# Patient Record
Sex: Male | Born: 1935 | Race: White | Hispanic: No | Marital: Married | Smoking: Former smoker
Health system: Southern US, Community
[De-identification: ages and names within clinical notes are randomized; demographics above are authoritative.]

## PROBLEM LIST (undated history)

## (undated) DIAGNOSIS — K439 Ventral hernia without obstruction or gangrene: Secondary | ICD-10-CM

## (undated) DIAGNOSIS — C61 Malignant neoplasm of prostate: Secondary | ICD-10-CM

## (undated) DIAGNOSIS — S82899A Other fracture of unspecified lower leg, initial encounter for closed fracture: Secondary | ICD-10-CM

## (undated) DIAGNOSIS — C801 Malignant (primary) neoplasm, unspecified: Secondary | ICD-10-CM

## (undated) DIAGNOSIS — S8290XA Unspecified fracture of unspecified lower leg, initial encounter for closed fracture: Secondary | ICD-10-CM

## (undated) HISTORY — DX: Other fracture of unspecified lower leg, initial encounter for closed fracture: S82.899A

## (undated) HISTORY — DX: Unspecified fracture of unspecified lower leg, initial encounter for closed fracture: S82.90XA

## (undated) HISTORY — DX: Ventral hernia without obstruction or gangrene: K43.9

## (undated) HISTORY — DX: Malignant (primary) neoplasm, unspecified: C80.1

---
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
## 1997-06-22 DIAGNOSIS — S8290XA Unspecified fracture of unspecified lower leg, initial encounter for closed fracture: Secondary | ICD-10-CM

## 1997-06-22 HISTORY — DX: Unspecified fracture of unspecified lower leg, initial encounter for closed fracture: S82.90XA

## 1999-06-23 DIAGNOSIS — S82899A Other fracture of unspecified lower leg, initial encounter for closed fracture: Secondary | ICD-10-CM

## 1999-06-23 HISTORY — DX: Other fracture of unspecified lower leg, initial encounter for closed fracture: S82.899A

## 2000-06-29 ENCOUNTER — Encounter: Payer: Self-pay | Admitting: Specialist

## 2000-06-30 ENCOUNTER — Inpatient Hospital Stay (HOSPITAL_COMMUNITY): Admission: RE | Admit: 2000-06-30 | Discharge: 2000-07-01 | Payer: Self-pay | Admitting: Specialist

## 2000-06-30 ENCOUNTER — Encounter: Payer: Self-pay | Admitting: Specialist

## 2009-10-15 ENCOUNTER — Ambulatory Visit (HOSPITAL_COMMUNITY): Admission: RE | Admit: 2009-10-15 | Discharge: 2009-10-15 | Payer: Self-pay | Admitting: Urology

## 2009-10-22 ENCOUNTER — Ambulatory Visit (HOSPITAL_COMMUNITY): Admission: RE | Admit: 2009-10-22 | Discharge: 2009-10-22 | Payer: Self-pay | Admitting: Urology

## 2009-12-20 ENCOUNTER — Observation Stay (HOSPITAL_COMMUNITY): Admission: RE | Admit: 2009-12-20 | Discharge: 2009-12-21 | Payer: Self-pay | Admitting: General Surgery

## 2009-12-20 ENCOUNTER — Encounter (INDEPENDENT_AMBULATORY_CARE_PROVIDER_SITE_OTHER): Payer: Self-pay | Admitting: Urology

## 2009-12-20 HISTORY — PX: PROSTATE SURGERY: SHX751

## 2010-01-10 ENCOUNTER — Ambulatory Visit (HOSPITAL_COMMUNITY): Admission: RE | Admit: 2010-01-10 | Discharge: 2010-01-10 | Payer: Self-pay | Admitting: General Surgery

## 2010-03-14 ENCOUNTER — Encounter (INDEPENDENT_AMBULATORY_CARE_PROVIDER_SITE_OTHER): Payer: Self-pay | Admitting: General Surgery

## 2010-03-14 HISTORY — PX: HERNIA REPAIR: SHX51

## 2010-03-15 ENCOUNTER — Inpatient Hospital Stay (HOSPITAL_COMMUNITY): Admission: RE | Admit: 2010-03-15 | Discharge: 2010-03-16 | Payer: Self-pay | Admitting: General Surgery

## 2010-05-28 ENCOUNTER — Encounter
Admission: RE | Admit: 2010-05-28 | Discharge: 2010-05-28 | Payer: Self-pay | Source: Home / Self Care | Admitting: General Surgery

## 2010-09-04 LAB — BASIC METABOLIC PANEL
BUN: 12 mg/dL (ref 6–23)
CO2: 31 mEq/L (ref 19–32)
Calcium: 8.6 mg/dL (ref 8.4–10.5)
Chloride: 102 mEq/L (ref 96–112)
Chloride: 104 mEq/L (ref 96–112)
Creatinine, Ser: 0.95 mg/dL (ref 0.4–1.5)
GFR calc non Af Amer: >60 mL/min (ref >60)
GFR calc non Af Amer: >60 mL/min (ref >60)
Potassium: 4.1 mEq/L (ref 3.5–5.1)
Potassium: 4.7 mEq/L (ref 3.5–5.1)
Sodium: 139 mEq/L (ref 135–145)

## 2010-09-04 LAB — CBC
HCT: 33.9 % — ABNORMAL LOW (ref 39.0–52.0)
HCT: 39.5 % (ref 39.0–52.0)
MCH: 30.9 pg (ref 26.0–34.0)
MCH: 30.9 pg (ref 26.0–34.0)
MCHC: 34.5 g/dL (ref 30.0–36.0)
MCHC: 34.6 g/dL (ref 30.0–36.0)
Platelets: 212 10*3/uL (ref 150–400)
RBC: 4.42 MIL/uL (ref 4.22–5.81)
RDW: 16.3 % — ABNORMAL HIGH (ref 11.5–15.5)
WBC: 8.8 10*3/uL (ref 4.0–10.5)

## 2010-09-04 LAB — DIFFERENTIAL
Basophils Absolute: 0.1 10*3/uL (ref 0.0–0.1)
Basophils Relative: 1 % (ref 0–1)
Eosinophils Absolute: 0.3 10*3/uL (ref 0.0–0.7)
Neutro Abs: 4.1 10*3/uL (ref 1.7–7.7)

## 2010-09-04 LAB — SURGICAL PCR SCREEN: MRSA, PCR: NEGATIVE

## 2010-09-06 LAB — BASIC METABOLIC PANEL
BUN: 11 mg/dL (ref 6–23)
CO2: 31 mEq/L (ref 19–32)
Creatinine, Ser: 0.92 mg/dL (ref 0.4–1.5)
Glucose, Bld: 116 mg/dL — ABNORMAL HIGH (ref 70–99)
Potassium: 4.6 mEq/L (ref 3.5–5.1)

## 2010-09-06 LAB — DIFFERENTIAL
Basophils Relative: 1 % (ref 0–1)
Eosinophils Relative: 2 % (ref 0–5)
Monocytes Absolute: 0.4 10*3/uL (ref 0.1–1.0)
Neutrophils Relative %: 66 % (ref 43–77)

## 2010-09-06 LAB — CBC
MCV: 88.7 fL (ref 78.0–100.0)
RDW: 15.2 % (ref 11.5–15.5)
WBC: 7.9 10*3/uL (ref 4.0–10.5)

## 2010-09-06 LAB — SURGICAL PCR SCREEN: Staphylococcus aureus: NEGATIVE

## 2010-09-07 LAB — COMPREHENSIVE METABOLIC PANEL
AST: 17 U/L (ref 0–37)
CO2: 29 mEq/L (ref 19–32)
Chloride: 104 mEq/L (ref 96–112)
Creatinine, Ser: 0.95 mg/dL (ref 0.4–1.5)
Glucose, Bld: 135 mg/dL — ABNORMAL HIGH (ref 70–99)
Potassium: 4 mEq/L (ref 3.5–5.1)
Sodium: 142 mEq/L (ref 135–145)

## 2010-09-07 LAB — URINALYSIS, ROUTINE W REFLEX MICROSCOPIC
Ketones, ur: NEGATIVE mg/dL
Protein, ur: 30 mg/dL — AB
Specific Gravity, Urine: 1.019 (ref 1.005–1.030)
Urobilinogen, UA: 0.2 mg/dL (ref 0.0–1.0)

## 2010-09-07 LAB — DIFFERENTIAL
Basophils Absolute: 0 10*3/uL (ref 0.0–0.1)
Eosinophils Absolute: 0.3 10*3/uL (ref 0.0–0.7)
Lymphocytes Relative: 29 % (ref 12–46)
Lymphs Abs: 2.2 10*3/uL (ref 0.7–4.0)

## 2010-09-07 LAB — CBC
HCT: 40.4 % (ref 39.0–52.0)
MCH: 30.3 pg (ref 26.0–34.0)
MCH: 30.7 pg (ref 26.0–34.0)
MCHC: 33.5 g/dL (ref 30.0–36.0)
MCHC: 34 g/dL (ref 30.0–36.0)
MCV: 90.4 fL (ref 78.0–100.0)
RBC: 3.94 MIL/uL — ABNORMAL LOW (ref 4.22–5.81)
RDW: 15.4 % (ref 11.5–15.5)
WBC: 9 10*3/uL (ref 4.0–10.5)

## 2010-09-07 LAB — URINE CULTURE
Colony Count: >=100000
Special Requests: NEGATIVE

## 2010-09-07 LAB — BASIC METABOLIC PANEL
BUN: 10 mg/dL (ref 6–23)
CO2: 29 mEq/L (ref 19–32)
Calcium: 8 mg/dL — ABNORMAL LOW (ref 8.4–10.5)
GFR calc non Af Amer: >60 mL/min (ref >60)
Potassium: 3.6 mEq/L (ref 3.5–5.1)

## 2010-09-07 LAB — PROTIME-INR
INR: 1.12 (ref 0.00–1.49)
Prothrombin Time: 14.3 seconds (ref 11.6–15.2)

## 2010-09-07 LAB — URINE MICROSCOPIC-ADD ON

## 2010-09-07 LAB — APTT: aPTT: 34 seconds (ref 24–37)

## 2010-09-09 LAB — CREATININE, SERUM
GFR calc Af Amer: >60 mL/min (ref >60)
GFR calc non Af Amer: >60 mL/min (ref >60)

## 2010-11-07 NOTE — Op Note (Signed)
[facility]  Patient:    Stephen English, Stephen English                     MRN: [id number] Proc. Date: 06/30/00 Adm. Date:  [id number] Attending:  Lubertha South                           Operative Report  PREOPERATIVE DIAGNOSIS:  Left closed displaced bimalleolar ankle fracture.  POSTOPERATIVE DIAGNOSIS:  Left closed displaced bimalleolar ankle fracture.  PROCEDURE:  Open reduction internal fixation of the left bimalleolar ankle fracture using two 4.0 medial malleolar screws and a single lag screw with a six hole 1/3 semitubular plate to the lateral malleolus.  SURGEON:  Dr. Vira Browns.  ASSISTANT:  Cherly Beach, P.A.-C.  ANESTHESIA:  GOT, Dr. Shireen Quan.  ESTIMATED BLOOD LOSS:  30 cc.  TOTAL TOURNIQUET TIME:  280 mmHg for 67 minutes.  BRIEF CLINICAL NOTE:  The patient is a 77 year old male who fell at the door to a store while he was shopping on New Years Eve, June 21, 2000. He reportedly fell twisting his ankle. He had had a history of previous left IM nailing of a comminuted distal tibial fracture and that has since healed the fracture of the left malleolus; the left ankle was a bimalleolar ankle fracture mildly displaced. He was seen initially in the emergency room at [facility]. Evaluated, posterior splint applied, placed on crutches nonweightbearing. He returns for open reduction internal fixation of his left ankle fracture.  INTRAOPERATIVE FINDINGS:  The patient had a comminuted lateral malleolus fracture at the level of plafond requiring debridement of the fracture site and some old scar tissue associated with the old previous fibula fracture in the vicinity. The medial malleolus fracture was a transverse fracture at the level just below the plafond.  DESCRIPTION OF PROCEDURE:  After adequate general anesthesia, the left lower extremity was prepped with duraprep solution, a bump under the left buttock, tourniquet about the left upper  thigh, and draped in the usual manner. The left lower extremity elevated and exsanguinated with Esmarch bandage, tourniquet inflated to 280 mmHg.  The incision over the area of the left superficial distal fibula in line with its superficial portion extending over the tip of the fibula distally and proximally over an area of about 10 cm through the skin and subcutaneous layers using a 10 blade scalpel carried down to the periosteum. Subperiosteal dissection then carried both medial and lateral using a sharp wooden handle periosteal elevator. The fracture site identified, debrided of bony fragments which were interpose preventing reduction. The entire lateral surface including the anterior and posterior aspects of the fibula fracture site were examined and the fracture site again debrided using curettes appropriately. With this then the fracture was reduced using a towel clip reducing tenaculum. A lag screw was then placed from distal posterior across the fracture site obliquely fixing it to the anterior medial aspect of the proximal fracture fragment. Drill holes first placed using a 3.5 drill bit and then using the top half sleeve and drilling the deep cortex measuring the depth almost 50 mm. A 50 mm screw was placed fixing the fracture site lagging it nicely. A six hole 1/3 semitubular plate was then carefully contoured to the lateral aspect of the fibula. This was then placed against the lateral aspect of the fibula affixed to the proximal fracture fragments using three 3.5 cordless screws drilling each hole measuring for  depth and placing the appropriate self tapping screw. The distal screw holes were then each filled with fully threaded cancellous screws. First the most proximal of the two screws and then successively each of the distal. Care was taken to ensure that none of the screws crossed into the joint line. Next, the medial malleolar fracture site was exposed using a curvilinear  excision flat based posterior. The saphenous vein was ligated until allow for exposure of the fracture site. It was located more medial and posterior than usual. The fracture site was then opened and the joint inspected, irrigation performed of the ankle joint using copious amounts of irrigant solution. The fracture site itself was debrided at the interpose periosteum as well as curetted of old hematoma and organizing hematoma here. The joint itself was irrigated and bony debris and cartilagineus debris within the joint was carefully irrigated out and removed. A bare rongeur used to remove some synovial tissue that had become interposed as well. With this then, the fracture site was reapproximated and a single 2 mm K wire affixed to the pin at the fracture site anteriorly. C-arm fluoro was used to ascertain the correct position and alignment of the fractured fragment, reduction and anatomic position alignment. A 2.5 drill was then used to drill parallel to the first pin. Using the parallel guide first in a 2 mm pin and then over drilling the 2 mm hole with a 2.5 drill. This was measured for depth and a 45 mm screw placed posteriorly. The anterior pin was then removed and a 45 mm screw placed anteriorly after first over drilling this again with a 2.5 drill tapping placed in the appropriate 4.0 cancellous screw. This fixed the medial malleolar fracture site nicely. Excellent purchase was obtained on the bony fragments and excellent compression across the fracture site. C-arm fluoroscopy was then used to obtain permanent C-arm images in the AP and 15 degree oblique and lateral views. The medial malleolus screw posteriorly showed some minimal posterior extension beyond the tibial cortex; however, it was not great enough to represent a problem. It was left in place as it provided excellent fixation. The medial incision was then closed, interrupted 2-0 Vicryl sutures to the subcutaneous layers,  skin closed with stainless steel staples. The lateral incision was closed by approximating the deep subcu layers with interrupted #0 Vicryl sutures, the more superficial  layers with interrupted 2-0 Vicryl sutures and the skin with stainless steel staples. Adaptic 4 x 4s, ABD pads affixed to the skin with a sterile Webril and a well padded posterior short leg cas was applied with the ankle at 90 degrees. The tourniquet was released. Total tourniquet time of 280 mmHg, 67 minutes. The patient was then reactivated and returned to the recovery room in satisfactory condition. All instrument and sponge counts were correct. DD:  06/30/00 TD:  06/30/00 Job: [id number]

## 2010-11-24 ENCOUNTER — Encounter (INDEPENDENT_AMBULATORY_CARE_PROVIDER_SITE_OTHER): Payer: Self-pay | Admitting: General Surgery

## 2011-02-11 ENCOUNTER — Encounter (INDEPENDENT_AMBULATORY_CARE_PROVIDER_SITE_OTHER): Payer: Self-pay | Admitting: General Surgery

## 2011-03-10 ENCOUNTER — Other Ambulatory Visit (HOSPITAL_COMMUNITY): Payer: Self-pay | Admitting: Urology

## 2011-03-10 DIAGNOSIS — C61 Malignant neoplasm of prostate: Secondary | ICD-10-CM

## 2011-03-13 ENCOUNTER — Ambulatory Visit (INDEPENDENT_AMBULATORY_CARE_PROVIDER_SITE_OTHER): Payer: Medicare Other | Admitting: General Surgery

## 2011-03-13 ENCOUNTER — Encounter (INDEPENDENT_AMBULATORY_CARE_PROVIDER_SITE_OTHER): Payer: Self-pay | Admitting: General Surgery

## 2011-03-13 VITALS — BP 134/86 | HR 60 | Temp 96.9°F | Resp 16 | Ht 69.0 in | Wt 223.8 lb

## 2011-03-13 DIAGNOSIS — Z9889 Other specified postprocedural states: Secondary | ICD-10-CM

## 2011-03-13 DIAGNOSIS — Z8719 Personal history of other diseases of the digestive system: Secondary | ICD-10-CM

## 2011-03-13 NOTE — Progress Notes (Signed)
Chief Complaint  Patient presents with  . Follow-up    [facility] 03/14/10    HPI Stephen English is a 77 y.o. male.  HPI 23 year old Caucasian male comes in today for long-term followup. I last saw him on July 04, 2010. In September 2011 he underwent an open repair of a chronically incarcerated large left inguinal hernia with mesh. He developed a postoperative hematoma that was slow to resolve. He comes in today for followup. He denies any nausea or vomiting. He denies any abdominal pain. He denies any numbness or tingling in his scrotum. He denies any bulge in his groin. He does occasionally have some infrequent stools.  Otherwise he denies any new medical problems, surgeries, or trips to  the hospital or emergency room. He states that his PSA level has increased in that Dr. Brunilda Payor is working it up.   Past Medical History  Diagnosis Date  . Broken ankle 2001  . Broken leg 1999  . Ventral hernia   . Cancer     prostate    Past Surgical History  Procedure Date  . Appendectomy 1948  . Hernia repair 03/14/10    [facility]  . Prostate surgery 12/2009    Family History  Problem Relation Age of Onset  . Cancer Mother     oral    Social History History  Substance Use Topics  . Smoking status: Former Games developer  . Smokeless tobacco: Not on file   Comment: quit 1987  . Alcohol Use: No    No Known Allergies  Current Outpatient Prescriptions  Medication Sig Dispense Refill  . Calcium Carbonate-Vitamin D (CALCIUM + D PO) Take by mouth daily.        . Naproxen Sodium (ALEVE PO) Take by mouth as needed.          Review of Systems Review of Systems  Constitutional: Negative for activity change, appetite change and unexpected weight change.  Eyes: Negative for redness and visual disturbance.  Respiratory: Negative for chest tightness and shortness of breath.   Cardiovascular: Negative for chest pain.  Gastrointestinal: Negative for nausea, vomiting, abdominal pain, diarrhea and blood in  stool.  Neurological: Negative for dizziness and seizures.  Hematological: Negative.   Psychiatric/Behavioral: Negative.     Blood pressure 134/86, pulse 60, temperature 96.9 F (36.1 C), temperature source Temporal, resp. rate 16, height 5\' 9"  (1.753 m), weight 223 lb 12.8 oz (101.515 kg).  Physical Exam Physical Exam  Vitals reviewed. Constitutional: He is oriented to person, place, and time. He appears well-developed and well-nourished.       Elderly appearing; uses a walker  HENT:  Head: Normocephalic and atraumatic.  Eyes: Conjunctivae are normal. No scleral icterus.  Neck: Normal range of motion. Neck supple. No JVD present.  Cardiovascular: Normal rate, regular rhythm and normal heart sounds.   Pulmonary/Chest: Effort normal and breath sounds normal. No respiratory distress. He has no wheezes.  Abdominal: Soft. Bowel sounds are normal. There is no tenderness. There is no rebound and no guarding. Hernia confirmed negative in the right inguinal area and confirmed negative in the left inguinal area.    Genitourinary:     Musculoskeletal: He exhibits no edema.  Lymphadenopathy:    He has no cervical adenopathy.  Neurological: He is alert and oriented to person, place, and time.  Skin: Skin is warm and dry.  Psychiatric: He has a normal mood and affect. His behavior is normal. Judgment and thought content normal.    Data Reviewed  My office note from 07/04/10   Assessment    S/p open repair of chronically incarcerated left inguinal hernia with mesh- no evidence of recurrence   Ventral hernia  Plan    I think he is doing well with respect to his inguinal area.  We did discuss strategies to improve his bowel habits (water and fiber). He was given Agricultural engineer.  He has had the ventral hernia for years & it doesn't bother him.  I recommended observation. We discussed signs & symptoms of incarceration & strangulation.  I will see on a prn basis  Atilano Ina,  MD       Gaynelle Adu M 03/13/2011, 1:33 PM

## 2011-03-13 NOTE — Patient Instructions (Signed)
If the bulge in your abdomen becomes hard & very tender, go to the emergency room

## 2011-03-19 ENCOUNTER — Encounter (HOSPITAL_COMMUNITY)
Admission: RE | Admit: 2011-03-19 | Discharge: 2011-03-19 | Disposition: A | Discharge status: Home / Self Care | Payer: Medicare Other | Source: Ambulatory Visit | Attending: Urology | Admitting: Urology

## 2011-03-19 DIAGNOSIS — C61 Malignant neoplasm of prostate: Secondary | ICD-10-CM

## 2011-03-19 DIAGNOSIS — Z8546 Personal history of malignant neoplasm of prostate: Secondary | ICD-10-CM | POA: Insufficient documentation

## 2011-03-19 DIAGNOSIS — C7951 Secondary malignant neoplasm of bone: Secondary | ICD-10-CM | POA: Insufficient documentation

## 2011-03-19 DIAGNOSIS — R972 Elevated prostate specific antigen [PSA]: Secondary | ICD-10-CM | POA: Insufficient documentation

## 2011-03-19 MED ORDER — TECHNETIUM TC 99M MEDRONATE IV KIT
25.0000 | PACK | Freq: Once | INTRAVENOUS | Status: AC | PRN
  Administered 2011-03-19: 25 via INTRAVENOUS

## 2011-12-15 IMAGING — CR DG CHEST 2V
3 series · 3 of 3 positions shown · non-contrast
Comparison: None

CLINICAL DATA: Preoperative evaluation for left scrotal inguinal
hernia. Ex-smoker

CHEST - 2 VIEW

[w chest pa]
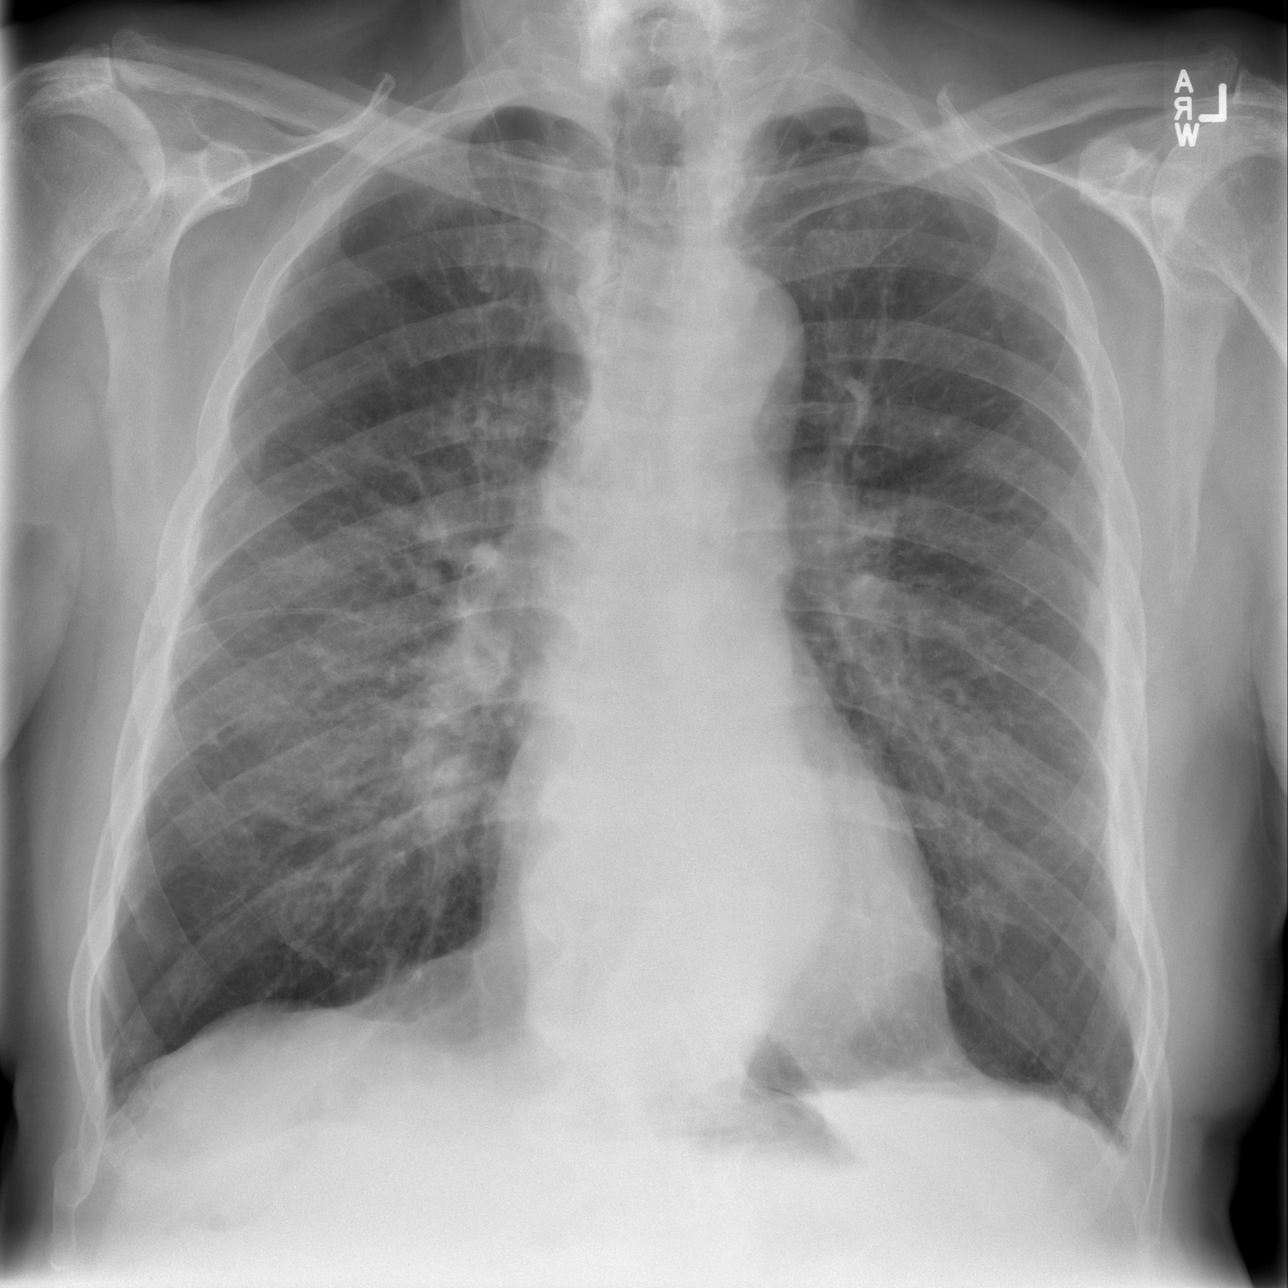

[w chest lat (1 of 2)]
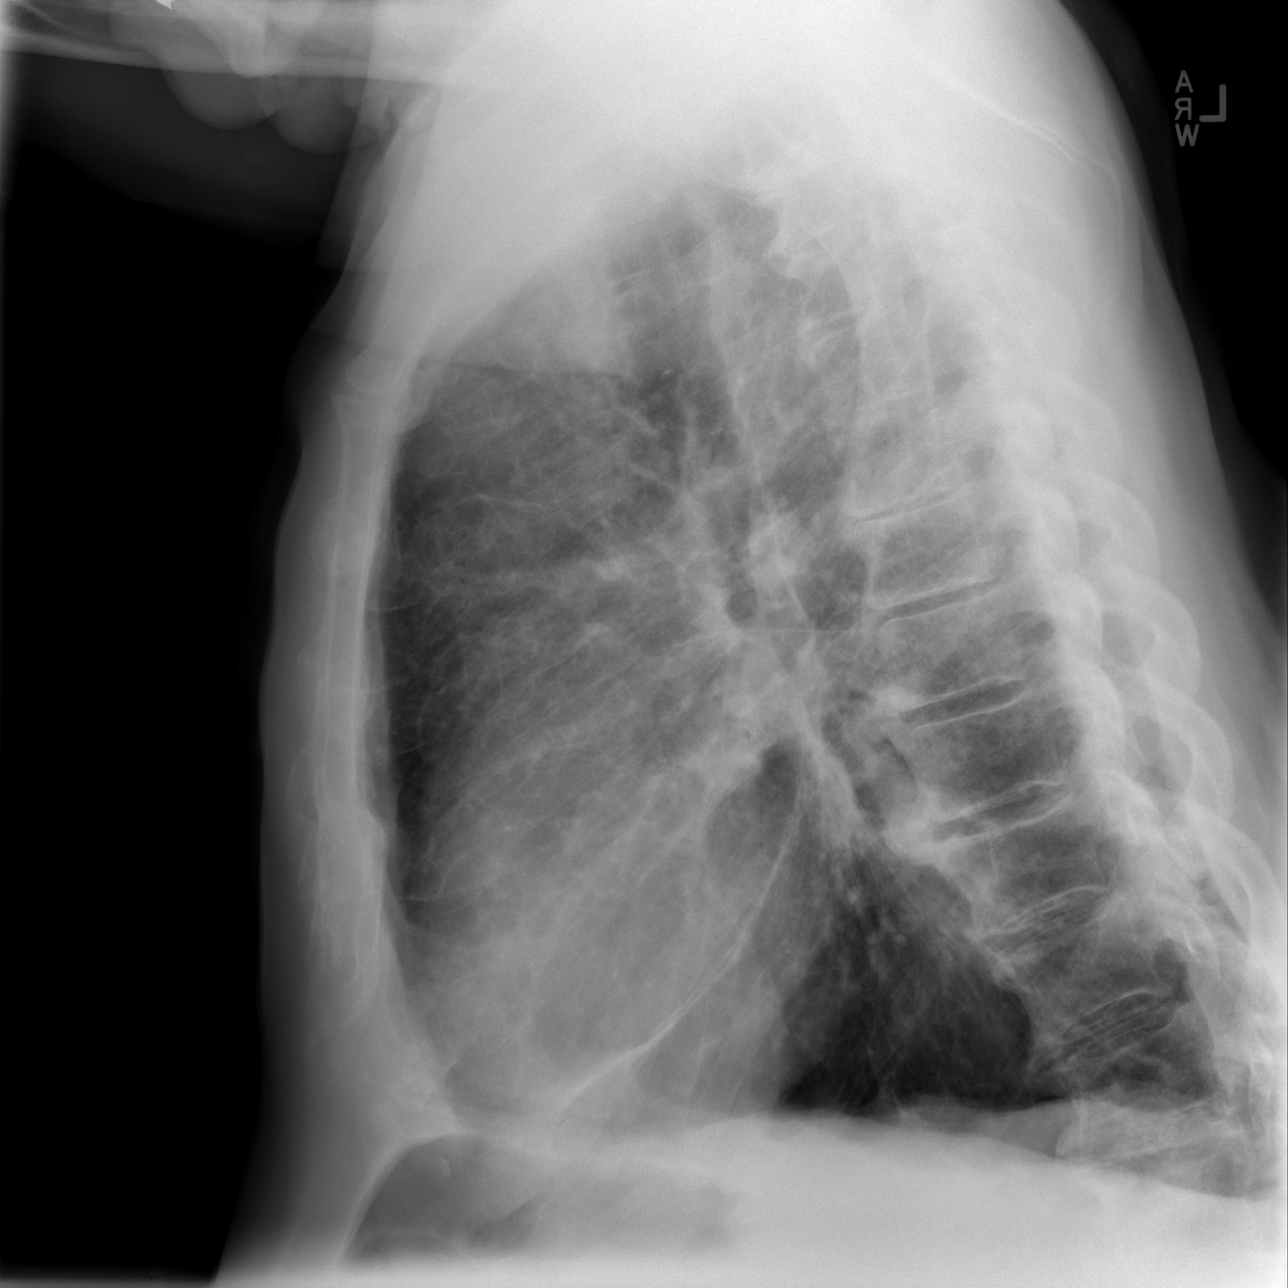

[w chest lat (2 of 2)]
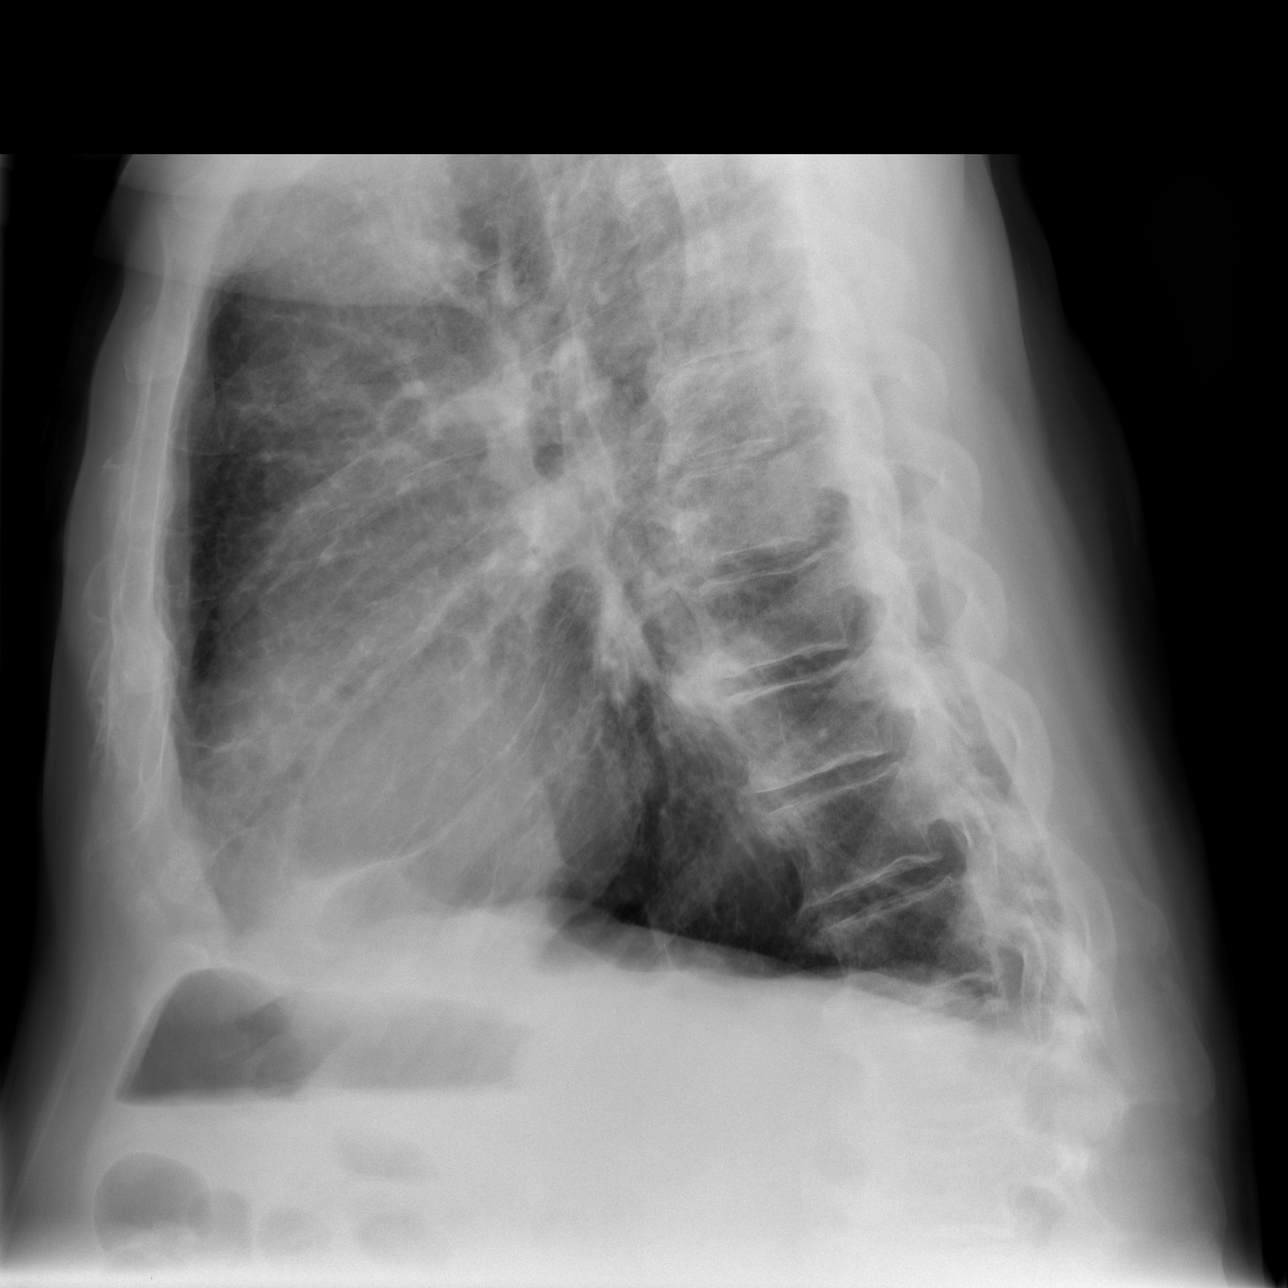

[3 of 3 positions shown; findings below may reference images not displayed]

FINDINGS: Changes of COPD are evident and taking this into
consideration heart size is upper limits of normal to mildly
enlarged.  Mild aortic ectasia is seen.  Mediastinal structures are
otherwise unremarkable.

Prominence of the interstitial markings with peribronchial cuffing
and fissural prominence is noted.  No pleural fluid or septal lines
are seen suggesting these changes are chronic in nature and not
indicative of early interstitial edema but likely related to the
patient's history of smoking.

No focal infiltrates are seen.  Mild bilateral apical pleural
thickening is noted.

Bony structures demonstrate degenerative osteophytosis of the mid
and lower thoracic spine.
IMPRESSION: No acute cardiopulmonary abnormality suggested.  Borderline cardiac
enlargement with chronic interstitial changes suspected.

## 2012-03-28 ENCOUNTER — Other Ambulatory Visit: Payer: Self-pay | Admitting: Specialist

## 2012-03-28 DIAGNOSIS — M25561 Pain in right knee: Secondary | ICD-10-CM

## 2012-04-01 ENCOUNTER — Ambulatory Visit
Admission: RE | Admit: 2012-04-01 | Discharge: 2012-04-01 | Disposition: A | Discharge status: Home / Self Care | Payer: Medicare Other | Source: Ambulatory Visit | Attending: Specialist | Admitting: Specialist

## 2012-04-01 DIAGNOSIS — M25561 Pain in right knee: Secondary | ICD-10-CM

## 2012-04-01 MED ORDER — GADOBENATE DIMEGLUMINE 529 MG/ML IV SOLN
20.0000 mL | Freq: Once | INTRAVENOUS | Status: AC | PRN
  Administered 2012-04-01: 20 mL via INTRAVENOUS

## 2012-11-01 ENCOUNTER — Telehealth: Payer: Self-pay | Admitting: Oncology

## 2012-11-01 NOTE — Telephone Encounter (Signed)
LVOM FOR PT TO RETURN CALL IN RE NP APPT.  °

## 2012-11-01 NOTE — Telephone Encounter (Signed)
S/W PT IN RE NP APPT 05/30 @ 1:30 W/DR. SHADAD REFERRING DR. Brunilda Payor DX- PROSTATE CA  WELCOME PACKET MAILED.

## 2012-11-02 ENCOUNTER — Other Ambulatory Visit (HOSPITAL_COMMUNITY): Payer: Self-pay | Admitting: Urology

## 2012-11-02 DIAGNOSIS — C61 Malignant neoplasm of prostate: Secondary | ICD-10-CM

## 2012-11-03 ENCOUNTER — Telehealth: Payer: Self-pay | Admitting: Oncology

## 2012-11-03 NOTE — Telephone Encounter (Signed)
C/D 11/03/12 for appt. 11/18/12

## 2012-11-11 ENCOUNTER — Encounter (HOSPITAL_COMMUNITY)
Admission: RE | Admit: 2012-11-11 | Discharge: 2012-11-11 | Disposition: A | Discharge status: Home / Self Care | Payer: Medicare Other | Source: Ambulatory Visit | Attending: Urology | Admitting: Urology

## 2012-11-11 ENCOUNTER — Other Ambulatory Visit: Payer: Self-pay | Admitting: Oncology

## 2012-11-11 DIAGNOSIS — C61 Malignant neoplasm of prostate: Secondary | ICD-10-CM

## 2012-11-11 MED ORDER — TECHNETIUM TC 99M MEDRONATE IV KIT
25.0000 | PACK | Freq: Once | INTRAVENOUS | Status: AC | PRN
  Administered 2012-11-11: 25 via INTRAVENOUS

## 2012-11-18 ENCOUNTER — Other Ambulatory Visit (HOSPITAL_BASED_OUTPATIENT_CLINIC_OR_DEPARTMENT_OTHER): Payer: Medicare Other | Admitting: Lab

## 2012-11-18 ENCOUNTER — Ambulatory Visit (HOSPITAL_BASED_OUTPATIENT_CLINIC_OR_DEPARTMENT_OTHER): Payer: Medicare Other | Admitting: Oncology

## 2012-11-18 ENCOUNTER — Ambulatory Visit (HOSPITAL_BASED_OUTPATIENT_CLINIC_OR_DEPARTMENT_OTHER): Payer: Medicare Other

## 2012-11-18 ENCOUNTER — Telehealth: Payer: Self-pay | Admitting: Oncology

## 2012-11-18 VITALS — BP 140/91 | HR 96 | Temp 98.2°F | Resp 17 | Ht 69.0 in | Wt 223.4 lb

## 2012-11-18 DIAGNOSIS — C7951 Secondary malignant neoplasm of bone: Secondary | ICD-10-CM

## 2012-11-18 DIAGNOSIS — C61 Malignant neoplasm of prostate: Secondary | ICD-10-CM

## 2012-11-18 DIAGNOSIS — E291 Testicular hypofunction: Secondary | ICD-10-CM

## 2012-11-18 LAB — COMPREHENSIVE METABOLIC PANEL (CC13)
CO2: 26 mEq/L (ref 22–29)
Calcium: 9.7 mg/dL (ref 8.4–10.4)
Chloride: 107 mEq/L (ref 98–107)
Glucose: 101 mg/dl — ABNORMAL HIGH (ref 70–99)
Potassium: 4.6 mEq/L (ref 3.5–5.1)
Sodium: 141 mEq/L (ref 136–145)

## 2012-11-18 LAB — CBC WITH DIFFERENTIAL/PLATELET
BASO%: 1 % (ref 0.0–2.0)
Eosinophils Absolute: 0.4 10*3/uL (ref 0.0–0.5)
MCHC: 33.6 g/dL (ref 32.0–36.0)
MONO#: 0.5 10*3/uL (ref 0.1–0.9)
NEUT#: 5.2 10*3/uL (ref 1.5–6.5)
Platelets: 219 10*3/uL (ref 140–400)
RBC: 4.47 10*6/uL (ref 4.20–5.82)
RDW: 14.9 % — ABNORMAL HIGH (ref 11.0–14.6)
WBC: 8.6 10*3/uL (ref 4.0–10.3)
lymph#: 2.4 10*3/uL (ref 0.9–3.3)

## 2012-11-18 NOTE — Telephone Encounter (Signed)
gv and printed appt sched and avs for pt  °

## 2012-11-18 NOTE — Progress Notes (Signed)
Reason for Referral: Prostate cancer   HPI: Stephen English is a pleasant 79 year old gentleman referred to me for the evaluation of prostate cancer. He is a pleasant gentleman in reasonably good health who presented in 2011 with symptoms of obstructive uropathy. At that time he was found to have an elevated PSA of 335. His workup at that time including a prostate biopsy that was done on April of 2011 showed a Gleason score of 4+5 equals 9. History workup revealed advanced disease with metastatic disease to the bone. Patient was treated by Dr. Brunilda Payor with bilateral orchiectomy that was done on June of 2011 the good PSA response initially but the PSA went up again to 81 in September of 2012. At that time he was started on Casodex and developed a good response with PSA dropping down to 10.9 and August of 2013. Most recently his PSA started to rise again with a PSA up to 12 in November 2013 and most recently his PSA was up to 18 on 10/24/2012 at that time his testosterone level is castrate at 27. A repeat bone scan completed on 11/11/2012 which showed widespread bony metastasis that is essentially unchanged.  Patient was referred to me for the evaluation for what appears to be castration resistant prostate cancer. In the meantime, he stopped taking Casodex.  Overall, he is asymptomatic from his disease. He has not reported any back pain, shoulder pain, hip pain or any genitourinary complaints. He is limited in his mobility due to arthritis in his knee but appears to be relatively in good health. He uses a walker for ambulation and able to use lawn mower.  His quality of life despite his age and his prostate cancer is reasonable at this time and fairly asymptomatic from his cancer.   Past Medical History  Diagnosis Date  . Broken ankle 2001  . Broken leg 1999  . Ventral hernia   . Cancer     prostate  :  Past Surgical History  Procedure Laterality Date  . Appendectomy  1948  . Hernia repair  03/14/10     [facility]  . Prostate surgery  12/2009  :  Current outpatient prescriptions: Current Outpatient Prescriptions  Medication Sig Dispense Refill  . Calcium Carbonate-Vitamin D (CALCIUM + D PO) Take by mouth daily.        Marland Kitchen levothyroxine (SYNTHROID, LEVOTHROID) 75 MCG tablet Take 75 mcg by mouth daily.      . megestrol (MEGACE) 20 MG tablet Take 20 mg by mouth daily.      . Naproxen Sodium (ALEVE PO) Take by mouth as needed.         No current facility-administered medications for this visit.     : No Known Allergies:  Family History  Problem Relation Age of Onset  . Cancer Mother     oral  :  History   Social History  . Marital Status: Married    Spouse Name: N/A    Number of Children: N/A  . Years of Education: N/A   Occupational History  . Not on file.   Social History Main Topics  . Smoking status: Former Games developer  . Smokeless tobacco: Not on file     Comment: quit 1987  . Alcohol Use: No  . Drug Use: No  . Sexually Active:    Other Topics Concern  . Not on file   Social History Narrative  . No narrative on file  :  A comprehensive review of systems was negative.  Exam: ECOG 2 Blood pressure 140/91, pulse 96, temperature 98.2 F (36.8 C), temperature source Oral, resp. rate 17, height 5\' 9"  (1.753 m), weight 223 lb 6.4 oz (101.334 kg), SpO2 99.00%. General appearance: alert and appears stated age Head: Normocephalic, without obvious abnormality, atraumatic Nose: Nares normal. Septum midline. Mucosa normal. No drainage or sinus tenderness. Throat: lips, mucosa, and tongue normal; teeth and gums normal Neck: no adenopathy, no carotid bruit, no JVD, supple, symmetrical, trachea midline and thyroid not enlarged, symmetric, no tenderness/mass/nodules Resp: clear to auscultation bilaterally Chest wall: no tenderness Cardio: regular rate and rhythm, S1, S2 normal, no murmur, click, rub or gallop GI: soft, non-tender; bowel sounds normal; no masses,  no  organomegaly Extremities: extremities normal, atraumatic, no cyanosis or edema Pulses: 2+ and symmetric Skin: Skin color, texture, turgor normal. No rashes or lesions   Recent Labs  11/18/12 1346  WBC 8.6  HGB 13.2  HCT 39.4  PLT 219     Nm Bone Scan Whole Body  11/11/2012   *RADIOLOGY REPORT*  Clinical Data: Prostate cancer rising PSA  NUCLEAR MEDICINE WHOLE BODY BONE SCINTIGRAPHY  Technique:  Whole body anterior and posterior images were obtained approximately 3 hours after intravenous injection of radiopharmaceutical.  Radiopharmaceutical: CURIE TC-MDP TECHNETIUM TC 47M MEDRONATE IV KIT  Comparison: Bone scan 03/19/2011  Findings: There is significant urine contamination within the right groin area and along the right lower extremity.  Stable metastatic disease within the mid thoracic spine and mid lumbar spine.  Metastatic skeletal uptake within the right proximal femur is also unchanged.  Mild uptake within the left and right posterior and anterior ribs and slightly less conspicuous than prior but consistent with metastatic disease.  There are no new foci of metastatic disease  There is increased uptake within the lateral compartment right knee which is likely degenerative in nature.   IMPRESSION:  1.  Skeletal metastasis within the spine, ribs, pelvis, and right proximal femur. 2.  No evidence of new metastatic lesions or disease progression.  3.  Uptake within the right knee is likely degenerative.   Original Report Authenticated By: Genevive Bi, M.D.    Assessment and Plan:   79 year old gentleman with the following issues:  1. Castration resistant prostate cancer with metastatic disease to the bone: His initial diagnosis was was in 2001 where presented with a Gleason score of 4+5 equals 9, PSA of 335 and bony metastasis. He is status post bilateral orchiectomy with excellent PSA response up till 2012. His PSA went up to 70 and Casodex was added at that time with a PSA  nadir down to 10. Most recently his PSA rose to 18 with a doubling time of over 6 months. His most recent bone scan did not show any new bony lesions. The natural course of castration resistant prostate cancer and treatment options were discussed today in detail with Mr. Bruschi and his family. I discussed with him second line hormonal manipulation including Zytiga, ketoconazole, and Xtandi. I've also discussed with him the role of Provenge immunotherapy. I also mentioned systemic chemotherapy which definitely will not be required at this time. My recommendation would be at this point for a short period of observation given the slow doubling time of his PSA and his stable bony metastasis is reasonable to give anti-androgen withdrawal ample time to see if that we'll drop his PSA down. If it doesn't, cycle on hormonal manipulation I think will be reasonable with her with ketoconazole or Zytiga. All his questions  were answered today he understands his treatment options as well as the nature of his disease.  2. Bone health: He is already on calcium and vitamin D and I agree with starting Xgeva at this point.  3. Androgen depravation: He is status post bilateral orchiectomy.  All his questions were answered today to his satisfaction.

## 2012-12-01 ENCOUNTER — Telehealth: Payer: Self-pay | Admitting: *Deleted

## 2012-12-01 NOTE — Telephone Encounter (Signed)
sw pt gv new time for 02/10/13. @ 1pm. Pt is aware...td

## 2013-02-10 ENCOUNTER — Other Ambulatory Visit: Payer: Medicare Other | Admitting: Lab

## 2013-02-10 ENCOUNTER — Telehealth: Payer: Self-pay | Admitting: Oncology

## 2013-02-10 ENCOUNTER — Ambulatory Visit: Payer: Medicare Other | Admitting: Oncology

## 2013-02-10 ENCOUNTER — Ambulatory Visit (HOSPITAL_BASED_OUTPATIENT_CLINIC_OR_DEPARTMENT_OTHER): Payer: Medicare Other | Admitting: Oncology

## 2013-02-10 ENCOUNTER — Other Ambulatory Visit (HOSPITAL_BASED_OUTPATIENT_CLINIC_OR_DEPARTMENT_OTHER): Payer: Medicare Other

## 2013-02-10 VITALS — BP 132/64 | HR 58 | Temp 99.4°F | Resp 17 | Ht 69.0 in | Wt 225.7 lb

## 2013-02-10 DIAGNOSIS — C61 Malignant neoplasm of prostate: Secondary | ICD-10-CM

## 2013-02-10 DIAGNOSIS — C7951 Secondary malignant neoplasm of bone: Secondary | ICD-10-CM

## 2013-02-10 LAB — COMPREHENSIVE METABOLIC PANEL (CC13)
Albumin: 3.2 g/dL — ABNORMAL LOW (ref 3.5–5.0)
CO2: 25 mEq/L (ref 22–29)
Calcium: 8.9 mg/dL (ref 8.4–10.4)
Glucose: 119 mg/dl (ref 70–140)
Sodium: 140 mEq/L (ref 136–145)
Total Bilirubin: 0.4 mg/dL (ref 0.20–1.20)
Total Protein: 7 g/dL (ref 6.4–8.3)

## 2013-02-10 LAB — CBC WITH DIFFERENTIAL/PLATELET
Eosinophils Absolute: 0.3 10*3/uL (ref 0.0–0.5)
HCT: 39.3 % (ref 38.4–49.9)
LYMPH%: 27.1 % (ref 14.0–49.0)
MONO#: 0.4 10*3/uL (ref 0.1–0.9)
NEUT#: 3.9 10*3/uL (ref 1.5–6.5)
Platelets: 217 10*3/uL (ref 140–400)
RBC: 4.29 10*6/uL (ref 4.20–5.82)
WBC: 6.4 10*3/uL (ref 4.0–10.3)

## 2013-02-10 LAB — PSA: PSA: 55.35 ng/mL — ABNORMAL HIGH (ref <=4.00)

## 2013-02-10 NOTE — Telephone Encounter (Signed)
Gave pt appt for lab and Md for november 2014

## 2013-02-10 NOTE — Progress Notes (Signed)
Hematology and Oncology Follow Up Visit  Stephen English [id number] May 18, 1925 79 y.o. 02/10/2013 1:49 PM   Principle Diagnosis: 79 year old gentleman with prostate cancer diagnosed in 2011 presented with obstructive uropathy and advanced disease to the bone. His PSA was 335 and his Gleason score was 4+5 equals 9.   Prior Therapy:  He is status post bilateral orchiectomy done in June of 2011 with an excellent PSA response. His PSA went to 21 in September of 2012 and Casodex was added with the PSA dropping down to 10.9. Most recently his PSA went up to 18 in May of 2014 and a repeat bone scan in 11/11/2012 showed essentially unchanged bony metastasis.  Current therapy: He is under consideration to start a second line hormonal manipulation and Casodex withdrawal. He is on Xgeva given at [facility] urology.  Interim History:  Stephen English presents today for a followup visit. He is an 79 year old man I saw back in May of 2014 for the evaluation of advanced prostate cancer that he is developing castration resistant disease. Since his last visit, he is completely asymptomatic. Is not reporting any back pain or shoulder pain or any bone pain. Is not reporting any change in his urination or his performance status. His appetite is excellent and he gained weight since his last visit. Overall performance status although limited but practically unchanged.  Medications: I have reviewed the patient's current medications.  Current Outpatient Prescriptions  Medication Sig Dispense Refill  . Calcium Carbonate-Vitamin D (CALCIUM + D PO) Take by mouth daily.        Marland Kitchen levothyroxine (SYNTHROID, LEVOTHROID) 75 MCG tablet Take 75 mcg by mouth daily.      . Naproxen Sodium (ALEVE PO) Take by mouth as needed.         No current facility-administered medications for this visit.     Allergies: No Known Allergies  Past Medical History, Surgical history, Social history, and Family History were reviewed and  updated.  Review of Systems: Constitutional:  Negative for fever, chills, night sweats, anorexia, weight loss, pain. Cardiovascular: no chest pain or dyspnea on exertion Respiratory: negative Neurological: negative Dermatological: negative ENT: negative Skin: Negative. Gastrointestinal: negative Genito-Urinary: negative Hematological and Lymphatic: negative Breast: negative Musculoskeletal: negative Remaining ROS negative. Physical Exam: Blood pressure 132/64, pulse 58, temperature 99.4 F (37.4 C), temperature source Oral, resp. rate 17, height 5\' 9"  (1.753 m), weight 225 lb 11.2 oz (102.377 kg), SpO2 98.00%. ECOG: 2 General appearance: alert Head: Normocephalic, without obvious abnormality, atraumatic Neck: no adenopathy, no carotid bruit, no JVD, supple, symmetrical, trachea midline and thyroid not enlarged, symmetric, no tenderness/mass/nodules Lymph nodes: Cervical, supraclavicular, and axillary nodes normal. Heart:regular rate and rhythm, S1, S2 normal, no murmur, click, rub or gallop Lung:chest clear, no wheezing, rales, normal symmetric air entry Abdomin: soft, non-tender, without masses or organomegaly EXT:no erythema, induration, or nodules   Lab Results: Lab Results  Component Value Date   WBC 6.4 02/10/2013   HGB 12.7* 02/10/2013   HCT 39.3 02/10/2013   MCV 91.6 02/10/2013   PLT 217 02/10/2013     Chemistry      Component Value Date/Time   NA 141 11/18/2012 1346   NA 139 03/15/2010 0425   K 4.6 11/18/2012 1346   K 4.1 03/15/2010 0425   CL 107 11/18/2012 1346   CL 104 03/15/2010 0425   CO2 26 11/18/2012 1346   CO2 30 03/15/2010 0425   BUN 16.3 11/18/2012 1346   BUN 12 03/15/2010 0425  CREATININE 1.0 11/18/2012 1346   CREATININE 0.90 03/15/2010 0425      Component Value Date/Time   CALCIUM 9.7 11/18/2012 1346   CALCIUM 8.6 03/15/2010 0425   ALKPHOS 53 11/18/2012 1346   ALKPHOS 53 12/18/2009 1420   AST 15 11/18/2012 1346   AST 17 12/18/2009 1420   ALT 10 11/18/2012  1346   ALT 12 12/18/2009 1420   BILITOT 0.55 11/18/2012 1346   BILITOT <0.1* 12/18/2009 1420        Impression and Plan:  79 year old gentleman with the following issues:  1. Castration resistant prostate cancer with metastatic disease to the bone. He is currently on Casodex withdrawal and with consideration for second line hormonal manipulation with possible Zytiga versus ketoconazole. I discussed the risks and benefits of these medications versus continued observation for the time being and I feel given the fact that he is asymptomatic and have rather limited performance status I favor holding off on any additional therapy to avoid any further side effects. Overall his clinical status is stable and if that changes then we can't consider treatment with one of those agents. For the time being, we'll continue to monitor his PSA closely and monitor his clinical status and intervene upon arising symptoms.  2. Bone health: I agree with Xgeva  At this point.  3. Androgen depravation he is status post orchiectomy.  3. Followup: In 3 months sooner if needed to.  Eli Hose, MD 8/22/20141:49 PM

## 2013-05-11 ENCOUNTER — Telehealth: Payer: Self-pay | Admitting: Oncology

## 2013-05-11 NOTE — Telephone Encounter (Signed)
pt called to r/s appt due to transportation problems...done

## 2013-05-12 ENCOUNTER — Ambulatory Visit: Payer: Medicare Other | Admitting: Oncology

## 2013-05-12 ENCOUNTER — Other Ambulatory Visit: Payer: Medicare Other | Admitting: Lab

## 2013-06-20 ENCOUNTER — Ambulatory Visit (HOSPITAL_BASED_OUTPATIENT_CLINIC_OR_DEPARTMENT_OTHER): Payer: Medicare Other | Admitting: Oncology

## 2013-06-20 ENCOUNTER — Other Ambulatory Visit (HOSPITAL_BASED_OUTPATIENT_CLINIC_OR_DEPARTMENT_OTHER): Payer: Medicare Other

## 2013-06-20 ENCOUNTER — Telehealth: Payer: Self-pay | Admitting: Oncology

## 2013-06-20 VITALS — BP 127/69 | HR 63 | Temp 97.8°F | Resp 17 | Ht 69.0 in | Wt 228.2 lb

## 2013-06-20 DIAGNOSIS — C7951 Secondary malignant neoplasm of bone: Secondary | ICD-10-CM

## 2013-06-20 DIAGNOSIS — C61 Malignant neoplasm of prostate: Secondary | ICD-10-CM

## 2013-06-20 LAB — CBC WITH DIFFERENTIAL/PLATELET
BASO%: 1.4 % (ref 0.0–2.0)
HCT: 39.3 % (ref 38.4–49.9)
MCHC: 33.3 g/dL (ref 32.0–36.0)
MONO#: 0.5 10*3/uL (ref 0.1–0.9)
RBC: 4.28 10*6/uL (ref 4.20–5.82)
RDW: 15.1 % — ABNORMAL HIGH (ref 11.0–14.6)
WBC: 8.5 10*3/uL (ref 4.0–10.3)
lymph#: 2.1 10*3/uL (ref 0.9–3.3)

## 2013-06-20 LAB — COMPREHENSIVE METABOLIC PANEL (CC13)
ALT: 11 U/L (ref 0–55)
AST: 13 U/L (ref 5–34)
Albumin: 3.4 g/dL — ABNORMAL LOW (ref 3.5–5.0)
Alkaline Phosphatase: 63 U/L (ref 40–150)
Anion Gap: 14 meq/L — ABNORMAL HIGH (ref 3–11)
BUN: 14.9 mg/dL (ref 7.0–26.0)
CO2: 25 meq/L (ref 22–29)
Calcium: 9.1 mg/dL (ref 8.4–10.4)
Chloride: 103 meq/L (ref 98–109)
Creatinine: 0.9 mg/dL (ref 0.7–1.3)
Glucose: 129 mg/dL (ref 70–140)
Potassium: 3.9 meq/L (ref 3.5–5.1)
Sodium: 142 meq/L (ref 136–145)
Total Bilirubin: 0.31 mg/dL (ref 0.20–1.20)
Total Protein: 7.1 g/dL (ref 6.4–8.3)

## 2013-06-20 NOTE — Telephone Encounter (Signed)
gv relative appt schedule for february.

## 2013-06-20 NOTE — Progress Notes (Signed)
Hematology and Oncology Follow Up Visit  Stephen English [id number] Nov 09, 1924 78 y.o. 06/20/2013 3:47 PM   Principle Diagnosis: 79 year old gentleman with prostate cancer diagnosed in 2011 presented with obstructive uropathy and advanced disease to the bone. His PSA was 335 and his Gleason score was 4+5 equals 9.   Prior Therapy:  He is status post bilateral orchiectomy done in June of 2011 with an excellent PSA response. His PSA went to 22 in September of 2012 and Casodex was added with the PSA dropping down to 10.9. Most recently his PSA went up to 18 in May of 2014 and a repeat bone scan in 11/11/2012 showed essentially unchanged bony metastasis.  Current therapy: He is under consideration to start a second line hormonal manipulation and Casodex withdrawal. He is on Xgeva given at [facility] urology.  Interim History:  Stephen English presents today for a followup visit. He is an 79 year old man with advanced prostate cancer that he is developing castration resistant disease. Since his last visit, he continues to be completely asymptomatic. Is not reporting any back pain or shoulder pain or any bone pain. Is not reporting any change in his urination or his performance status. His appetite is excellent and he gained weight since his last visit. Overall performance status although limited but practically unchanged. He has not reported any hospitalization or illnesses. Has not reported any pathological fractures or recurrent infections.  Medications: I have reviewed the patient's current medications.  Current Outpatient Prescriptions  Medication Sig Dispense Refill  . Calcium Carbonate-Vitamin D (CALCIUM + D PO) Take by mouth daily.        Marland Kitchen levothyroxine (SYNTHROID, LEVOTHROID) 75 MCG tablet Take 75 mcg by mouth daily.      . Naproxen Sodium (ALEVE PO) Take by mouth as needed.         No current facility-administered medications for this visit.     Allergies: No Known Allergies  Past  Medical History, Surgical history, Social history, and Family History were reviewed and updated.  Review of Systems:  Remaining ROS negative. Physical Exam: Blood pressure 127/69, pulse 63, temperature 97.8 F (36.6 C), temperature source Oral, resp. rate 17, height 5\' 9"  (1.753 m), weight 228 lb 3.2 oz (103.511 kg), SpO2 97.00%. ECOG: 2 General appearance: alert Head: Normocephalic, without obvious abnormality, atraumatic Neck: no adenopathy, no carotid bruit, no JVD, supple, symmetrical, trachea midline and thyroid not enlarged, symmetric, no tenderness/mass/nodules Lymph nodes: Cervical, supraclavicular, and axillary nodes normal. Heart:regular rate and rhythm, S1, S2 normal, no murmur, click, rub or gallop Lung:chest clear, no wheezing, rales, normal symmetric air entry Abdomin: soft, non-tender, without masses or organomegaly EXT:no erythema, induration, or nodules   Lab Results: Lab Results  Component Value Date   WBC 8.5 06/20/2013   HGB 13.1 06/20/2013   HCT 39.3 06/20/2013   MCV 91.9 06/20/2013   PLT 209 06/20/2013     Chemistry      Component Value Date/Time   NA 142 06/20/2013 1450   NA 139 03/15/2010 0425   K 3.9 06/20/2013 1450   K 4.1 03/15/2010 0425   CL 107 11/18/2012 1346   CL 104 03/15/2010 0425   CO2 25 06/20/2013 1450   CO2 30 03/15/2010 0425   BUN 14.9 06/20/2013 1450   BUN 12 03/15/2010 0425   CREATININE 0.9 06/20/2013 1450   CREATININE 0.90 03/15/2010 0425      Component Value Date/Time   CALCIUM 9.1 06/20/2013 1450   CALCIUM 8.6 03/15/2010 0425  ALKPHOS 63 06/20/2013 1450   ALKPHOS 53 12/18/2009 1420   AST 13 06/20/2013 1450   AST 17 12/18/2009 1420   ALT 11 06/20/2013 1450   ALT 12 12/18/2009 1420   BILITOT 0.31 06/20/2013 1450   BILITOT <0.1* 12/18/2009 1420        Impression and Plan:  79 year old gentleman with the following issues:  1. Castration resistant prostate cancer with metastatic disease to the bone. He is currently on Casodex  withdrawal and with consideration for second line hormonal manipulation with possible Zytiga versus Xtandi. I discussed the risks and benefits of these medications versus continued observation. Written information about Stephen English was given today as well as another discussion with the risks associated with this drug. We will await the results of his next PSA and he will make the decision and let me know in the near future.  2. Bone health: I agree with Xgeva  At this point.  3. Androgen depravation he is status post orchiectomy.  4. Followup: In 2 months.   Eli Hose, MD 12/30/20143:47 PM

## 2013-06-23 ENCOUNTER — Telehealth: Payer: Self-pay | Admitting: *Deleted

## 2013-06-23 NOTE — Telephone Encounter (Signed)
Spoke with patient, gave results of PSA. 

## 2013-06-23 NOTE — Telephone Encounter (Signed)
Message copied by Randolm Idol on Fri Jun 23, 2013 10:49 AM ------      Message from: Wyatt Portela      Created: Wed Jun 21, 2013  8:26 AM       Please call his PSA. It is up and I recommend starting Xtandi (I gave him information yesterday). ------

## 2013-06-27 ENCOUNTER — Telehealth: Payer: Self-pay | Admitting: *Deleted

## 2013-06-27 ENCOUNTER — Other Ambulatory Visit: Payer: Self-pay | Admitting: Oncology

## 2013-06-27 DIAGNOSIS — C61 Malignant neoplasm of prostate: Secondary | ICD-10-CM

## 2013-06-27 MED ORDER — ENZALUTAMIDE 40 MG PO CAPS: 160.0000 mg | ORAL_CAPSULE | Freq: Every day | ORAL | Status: DC

## 2013-06-27 NOTE — Telephone Encounter (Signed)
Patient calling to say he has decided to try xtandi. Note to dr Hazeline Junker desk

## 2013-06-28 ENCOUNTER — Encounter: Payer: Self-pay | Admitting: Oncology

## 2013-06-28 NOTE — Progress Notes (Signed)
Faxed xtandi prescription to [facility] °

## 2013-07-03 ENCOUNTER — Encounter: Payer: Self-pay | Admitting: Oncology

## 2013-07-03 NOTE — Progress Notes (Signed)
Per PAN ... Patient was approved Stephen English was approved for 07/03/13-07/02/14  $11,000.00. Will advise billing and send to medical records.

## 2013-07-05 ENCOUNTER — Telehealth: Payer: Self-pay | Admitting: *Deleted

## 2013-07-05 NOTE — Telephone Encounter (Signed)
[facility] sent facsimile confirmation of Xtandi prescription shipment.  Stephen English was shipped on 07-04-2013 with next business day delivery.

## 2013-07-28 ENCOUNTER — Other Ambulatory Visit: Payer: Self-pay | Admitting: *Deleted

## 2013-07-28 DIAGNOSIS — C61 Malignant neoplasm of prostate: Secondary | ICD-10-CM

## 2013-07-28 MED ORDER — ENZALUTAMIDE 40 MG PO CAPS: 160.0000 mg | ORAL_CAPSULE | Freq: Every day | ORAL | Status: DC

## 2013-07-28 NOTE — Telephone Encounter (Signed)
THIS REFILL REQUEST FOR XTANDI WAS PLACED IN DR.SHADAD'S ACTIVE WORK FOLDER. 

## 2013-07-28 NOTE — Telephone Encounter (Signed)
RECEIVED A FAX FROM [facility] CONCERNING A CONFIRMATION OF FACSIMILE RECEIPT FOR PT. REFERRAL. 

## 2013-08-01 NOTE — Telephone Encounter (Signed)
RECEIVED A FAX FROM [facility] CONCERNING A CONFIRMATION OF PRESCRIPTION SHIPMENT FOR XTANDI ON 07/31/13.

## 2013-08-11 ENCOUNTER — Telehealth: Payer: Self-pay | Admitting: Oncology

## 2013-08-11 NOTE — Telephone Encounter (Signed)
moved 2/24 appt to 2/27 w/KC due to [facility]. s/w pt he is aware.

## 2013-08-15 ENCOUNTER — Other Ambulatory Visit: Payer: Medicare Other

## 2013-08-15 ENCOUNTER — Ambulatory Visit: Payer: Medicare Other | Admitting: Oncology

## 2013-08-17 ENCOUNTER — Telehealth: Payer: Self-pay | Admitting: Oncology

## 2013-08-17 NOTE — Telephone Encounter (Signed)
r/s fro 2/26 per pt due to weather

## 2013-08-18 ENCOUNTER — Ambulatory Visit: Payer: Self-pay | Admitting: Oncology

## 2013-08-18 ENCOUNTER — Other Ambulatory Visit: Payer: Medicare Other

## 2013-08-25 ENCOUNTER — Other Ambulatory Visit (HOSPITAL_BASED_OUTPATIENT_CLINIC_OR_DEPARTMENT_OTHER): Payer: Medicare Other

## 2013-08-25 ENCOUNTER — Ambulatory Visit (HOSPITAL_BASED_OUTPATIENT_CLINIC_OR_DEPARTMENT_OTHER): Payer: Medicare Other | Admitting: Oncology

## 2013-08-25 ENCOUNTER — Telehealth: Payer: Self-pay | Admitting: Oncology

## 2013-08-25 ENCOUNTER — Other Ambulatory Visit: Payer: Self-pay | Admitting: *Deleted

## 2013-08-25 ENCOUNTER — Encounter: Payer: Self-pay | Admitting: Oncology

## 2013-08-25 ENCOUNTER — Telehealth: Payer: Self-pay | Admitting: Medical Oncology

## 2013-08-25 VITALS — BP 143/74 | HR 61 | Temp 97.0°F | Resp 18 | Ht 69.0 in | Wt 223.7 lb

## 2013-08-25 DIAGNOSIS — C7952 Secondary malignant neoplasm of bone marrow: Secondary | ICD-10-CM

## 2013-08-25 DIAGNOSIS — F411 Generalized anxiety disorder: Secondary | ICD-10-CM

## 2013-08-25 DIAGNOSIS — C7951 Secondary malignant neoplasm of bone: Secondary | ICD-10-CM | POA: Insufficient documentation

## 2013-08-25 DIAGNOSIS — C61 Malignant neoplasm of prostate: Secondary | ICD-10-CM

## 2013-08-25 LAB — COMPREHENSIVE METABOLIC PANEL (CC13)
ALT: 9 U/L (ref 0–55)
ANION GAP: 11 meq/L (ref 3–11)
AST: 14 U/L (ref 5–34)
Albumin: 3.2 g/dL — ABNORMAL LOW (ref 3.5–5.0)
Alkaline Phosphatase: 49 U/L (ref 40–150)
BUN: 12.7 mg/dL (ref 7.0–26.0)
CALCIUM: 9 mg/dL (ref 8.4–10.4)
CHLORIDE: 106 meq/L (ref 98–109)
CO2: 26 meq/L (ref 22–29)
CREATININE: 0.8 mg/dL (ref 0.7–1.3)
Glucose: 110 mg/dl (ref 70–140)
Potassium: 3.9 mEq/L (ref 3.5–5.1)
SODIUM: 143 meq/L (ref 136–145)
TOTAL PROTEIN: 6.9 g/dL (ref 6.4–8.3)
Total Bilirubin: 0.41 mg/dL (ref 0.20–1.20)

## 2013-08-25 LAB — PSA: PSA: 12.73 ng/mL — ABNORMAL HIGH (ref <=4.00)

## 2013-08-25 LAB — CBC WITH DIFFERENTIAL/PLATELET
BASO%: 1.2 % (ref 0.0–2.0)
Basophils Absolute: 0.1 10*3/uL (ref 0.0–0.1)
EOS ABS: 0.4 10*3/uL (ref 0.0–0.5)
EOS%: 4.8 % (ref 0.0–7.0)
HEMATOCRIT: 39.9 % (ref 38.4–49.9)
HGB: 13.2 g/dL (ref 13.0–17.1)
LYMPH#: 2.1 10*3/uL (ref 0.9–3.3)
LYMPH%: 27.6 % (ref 14.0–49.0)
MCH: 29.8 pg (ref 27.2–33.4)
MCHC: 33 g/dL (ref 32.0–36.0)
MCV: 90.2 fL (ref 79.3–98.0)
MONO#: 0.6 10*3/uL (ref 0.1–0.9)
MONO%: 7.3 % (ref 0.0–14.0)
NEUT%: 59.1 % (ref 39.0–75.0)
NEUTROS ABS: 4.5 10*3/uL (ref 1.5–6.5)
Platelets: 245 10*3/uL (ref 140–400)
RBC: 4.43 10*6/uL (ref 4.20–5.82)
RDW: 14.7 % — ABNORMAL HIGH (ref 11.0–14.6)
WBC: 7.7 10*3/uL (ref 4.0–10.3)

## 2013-08-25 MED ORDER — ENZALUTAMIDE 40 MG PO CAPS: 160.0000 mg | ORAL_CAPSULE | Freq: Every day | ORAL | Status: DC

## 2013-08-25 NOTE — Progress Notes (Signed)
Hematology and Oncology Follow Up Visit  Stephen English [id number] April 04, 1925 80 y.o. 08/25/2013 2:21 PM   Principle Diagnosis: 80 year old gentleman with prostate cancer diagnosed in 2011 presented with obstructive uropathy and advanced disease to the bone. His PSA was 335 and his Gleason score was 4+5 equals 9.   Prior Therapy:  He is status post bilateral orchiectomy done in June of 2011 with an excellent PSA response. His PSA went to 49 in September of 2012 and Casodex was added with the PSA dropping down to 10.9. Most recently his PSA went up to 18 in May of 2014 and a repeat bone scan in 11/11/2012 showed essentially unchanged bony metastasis.  Current therapy: Started Xtandi 160 mg daily in January 2015.Marland Kitchen He is on Xgeva given at [facility] urology.  Interim History:  Stephen English presents today for a followup visit. 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 Since his last visit, he continues to be completely asymptomatic. Is not reporting any back pain or shoulder pain or any bone pain. Is not reporting any change in his urination or his performance status. His appetite is excellent, but he has lost a few pounds since last visit. Overall performance status although limited but practically unchanged. He has not reported any hospitalization or illnesses. Has not reported any pathological fractures or recurrent infections. Reports that he feels more nervous over the past month. He reports that he is having more vivid dreams and sometimes sees his cat when the cat is not there. Upon further questioning, he thinks that some of these symptoms may have predated the [facility] and started around the time of his surgery in 2011. No seizures. Denies chest pain, SOB, DOE, abdominal pain, nausea, and vomiting.   Medications: I have reviewed the patient's current medications.  Current Outpatient Prescriptions  Medication Sig Dispense Refill   . Calcium Carbonate-Vitamin D (CALCIUM + D PO) Take by mouth daily.        . enzalutamide (XTANDI) 40 MG capsule Take 4 capsules (160 mg total) by mouth daily.  120 capsule  0  . levothyroxine (SYNTHROID, LEVOTHROID) 75 MCG tablet Take 75 mcg by mouth daily.      . Naproxen Sodium (ALEVE PO) Take by mouth as needed.         No current facility-administered medications for this visit.     Allergies: No Known Allergies  Past Medical History, Surgical history, Social history, and Family History were reviewed and updated.  Review of Systems:  Remaining ROS negative. Physical Exam: Blood pressure 143/74, pulse 61, temperature 97 F (36.1 C), temperature source Oral, resp. rate 18, height 5\' 9"  (1.753 m), weight 223 lb 11.2 oz (101.47 kg). ECOG: 2 General appearance: alert Head: Normocephalic, without obvious abnormality, atraumatic Neck: no adenopathy, no carotid bruit, no JVD, supple, symmetrical, trachea midline and thyroid not enlarged, symmetric, no tenderness/mass/nodules Lymph nodes: Cervical, supraclavicular, and axillary nodes normal. Heart:regular rate and rhythm, S1, S2 normal, no murmur, click, rub or gallop Lung:chest clear, no wheezing, rales, normal symmetric air entry Abdomen: soft, non-tender, without masses or organomegaly EXT:no erythema, induration, or nodules   Lab Results: Lab Results  Component Value Date   WBC 7.7 08/25/2013   HGB 13.2 08/25/2013   HCT 39.9 08/25/2013   MCV 90.2 08/25/2013   PLT 245 08/25/2013     Chemistry      Component Value Date/Time   NA 142 06/20/2013 1450   NA 139 03/15/2010  0425   K 3.9 06/20/2013 1450   K 4.1 03/15/2010 0425   CL 107 11/18/2012 1346   CL 104 03/15/2010 0425   CO2 25 06/20/2013 1450   CO2 30 03/15/2010 0425   BUN 14.9 06/20/2013 1450   BUN 12 03/15/2010 0425   CREATININE 0.9 06/20/2013 1450   CREATININE 0.90 03/15/2010 0425      Component Value Date/Time   CALCIUM 9.1 06/20/2013 1450   CALCIUM 8.6 03/15/2010 0425    ALKPHOS 63 06/20/2013 1450   ALKPHOS 53 12/18/2009 1420   AST 13 06/20/2013 1450   AST 17 12/18/2009 1420   ALT 11 06/20/2013 1450   ALT 12 12/18/2009 1420   BILITOT 0.31 06/20/2013 1450   BILITOT <0.1* 12/18/2009 1420     Results for ZAMIRE, WHITEHURST (MRN [id number]) as of 08/25/2013 14:21  Ref. Range 11/18/2012 13:46 02/10/2013 13:16 06/20/2013 14:51  PSA Latest Range: <=4.00 ng/mL 19.64 (H) 55.35 (H) 114.90 (H)    Impression and Plan:  80 year old gentleman with the following issues:  1. Castration resistant prostate cancer with metastatic disease to the bone. He is currently on Xtandi and tolerating well. Plan is to continue with the current dose. PSA is pending today.  2. Bone health: Continue Xgeva at [facility].  3. Androgen depravation he is status post orchiectomy.  4. Anxiety/vivid dreams: Unlikely due to [place], but I have instructed the patient to monitor this and to contact us if these symptoms worsen.  5. Followup: 4-5 weeks.   Stephen English 3/6/20152:21 PM

## 2013-08-25 NOTE — Telephone Encounter (Signed)
THIS REFILL REQUEST FOR XTANDI WAS PLACED IN DR.SHADAD'S IN ACTIVE WORK FOLDER.

## 2013-08-25 NOTE — Telephone Encounter (Signed)
Prescription for [facility] faxed to [facility] @ [phone]

## 2013-08-25 NOTE — Telephone Encounter (Signed)
gv adn printed appt sched and avs for pt for April  °

## 2013-08-28 NOTE — Telephone Encounter (Signed)
RECEIVED A FAX FROM [facility] CONCERNING A CONFIRMATION OF FACSIMILE RECEIPT FOR PT. REFERRAL. 

## 2013-09-25 ENCOUNTER — Telehealth: Payer: Self-pay | Admitting: *Deleted

## 2013-09-25 NOTE — Telephone Encounter (Signed)
Refill request for xtandi given to Dr. Alen Blew. (fax from [facility]).

## 2013-09-26 ENCOUNTER — Other Ambulatory Visit: Payer: Self-pay | Admitting: Medical Oncology

## 2013-09-26 DIAGNOSIS — C7951 Secondary malignant neoplasm of bone: Secondary | ICD-10-CM

## 2013-09-26 DIAGNOSIS — C61 Malignant neoplasm of prostate: Secondary | ICD-10-CM

## 2013-09-26 MED ORDER — ENZALUTAMIDE 40 MG PO CAPS: 160.0000 mg | ORAL_CAPSULE | Freq: Every day | ORAL | Status: DC

## 2013-09-26 NOTE — Telephone Encounter (Signed)
Prescription refill for Xtandi 40 mg capsules faxed to [facility] @ [phone].

## 2013-09-27 NOTE — Telephone Encounter (Signed)
[facility] faxed confirmation of facsimile receipt for Xtandi prescription referral.  [facility] will verify insurance and make delivery arrangements with patient. 

## 2013-10-06 ENCOUNTER — Telehealth: Payer: Self-pay | Admitting: Oncology

## 2013-10-06 ENCOUNTER — Other Ambulatory Visit (HOSPITAL_BASED_OUTPATIENT_CLINIC_OR_DEPARTMENT_OTHER): Payer: Medicare Other

## 2013-10-06 ENCOUNTER — Ambulatory Visit (HOSPITAL_BASED_OUTPATIENT_CLINIC_OR_DEPARTMENT_OTHER): Payer: Medicare Other | Admitting: Oncology

## 2013-10-06 ENCOUNTER — Encounter: Payer: Self-pay | Admitting: Oncology

## 2013-10-06 VITALS — BP 132/71 | HR 65 | Temp 98.1°F | Resp 18 | Ht 69.0 in | Wt 222.0 lb

## 2013-10-06 DIAGNOSIS — F411 Generalized anxiety disorder: Secondary | ICD-10-CM

## 2013-10-06 DIAGNOSIS — C7951 Secondary malignant neoplasm of bone: Secondary | ICD-10-CM

## 2013-10-06 DIAGNOSIS — C61 Malignant neoplasm of prostate: Secondary | ICD-10-CM

## 2013-10-06 DIAGNOSIS — C7952 Secondary malignant neoplasm of bone marrow: Secondary | ICD-10-CM

## 2013-10-06 LAB — COMPREHENSIVE METABOLIC PANEL (CC13)
ALBUMIN: 3.1 g/dL — AB (ref 3.5–5.0)
ALK PHOS: 52 U/L (ref 40–150)
ALT: <6 U/L (ref 0–55)
AST: 11 U/L (ref 5–34)
Anion Gap: 9 mEq/L (ref 3–11)
BUN: 12.2 mg/dL (ref 7.0–26.0)
CO2: 27 mEq/L (ref 22–29)
Calcium: 9 mg/dL (ref 8.4–10.4)
Chloride: 106 mEq/L (ref 98–109)
Creatinine: 0.8 mg/dL (ref 0.7–1.3)
Glucose: 117 mg/dl (ref 70–140)
POTASSIUM: 4 meq/L (ref 3.5–5.1)
SODIUM: 142 meq/L (ref 136–145)
TOTAL PROTEIN: 6.9 g/dL (ref 6.4–8.3)
Total Bilirubin: 0.38 mg/dL (ref 0.20–1.20)

## 2013-10-06 LAB — CBC WITH DIFFERENTIAL/PLATELET
BASO%: 0.9 % (ref 0.0–2.0)
Basophils Absolute: 0.1 10*3/uL (ref 0.0–0.1)
EOS%: 5.5 % (ref 0.0–7.0)
Eosinophils Absolute: 0.5 10*3/uL (ref 0.0–0.5)
HCT: 37.5 % — ABNORMAL LOW (ref 38.4–49.9)
HGB: 12.5 g/dL — ABNORMAL LOW (ref 13.0–17.1)
LYMPH%: 21 % (ref 14.0–49.0)
MCH: 30.4 pg (ref 27.2–33.4)
MCHC: 33.3 g/dL (ref 32.0–36.0)
MCV: 91.2 fL (ref 79.3–98.0)
MONO#: 0.6 10*3/uL (ref 0.1–0.9)
MONO%: 6.6 % (ref 0.0–14.0)
NEUT#: 6.1 10*3/uL (ref 1.5–6.5)
NEUT%: 66 % (ref 39.0–75.0)
Platelets: 254 10*3/uL (ref 140–400)
RBC: 4.11 10*6/uL — AB (ref 4.20–5.82)
RDW: 15.6 % — AB (ref 11.0–14.6)
WBC: 9.2 10*3/uL (ref 4.0–10.3)
lymph#: 1.9 10*3/uL (ref 0.9–3.3)

## 2013-10-06 NOTE — Telephone Encounter (Signed)
gv and printed appt sched and avs for pt fro May... °

## 2013-10-06 NOTE — Progress Notes (Signed)
Hematology and Oncology Follow Up Visit  Stephen English [id number] Aug 21, 1924 80 y.o. 10/06/2013 4:12 PM   Principle Diagnosis: 80 year old gentleman with prostate cancer diagnosed in 2011 presented with obstructive uropathy and advanced disease to the bone. His PSA was 335 and his Gleason score was 4+5 equals 9.   Prior Therapy:  He is status post bilateral orchiectomy done in June of 2011 with an excellent PSA response. His PSA went to 5 in September of 2012 and Casodex was added with the PSA dropping down to 10.9. Most recently his PSA went up to 18 in May of 2014 and a repeat bone scan in 11/11/2012 showed essentially unchanged bony metastasis.  Current therapy: Started Xtandi 160 mg daily in January 2015.Marland Kitchen He is on Xgeva given at [facility] urology.  Interim History:  Stephen English presents today for a followup visit. 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 Since his last visit, he continues to be completely asymptomatic. He is not reporting any change in his urination or his performance status. His appetite is excellent and performance status although limited but practically unchanged. He has not reported any hospitalization or illnesses. Has not reported any pathological fractures or recurrent infections.He reports that he is having more vivid dreams and occasional hallucinations. Clear whether these symptoms started well before the start of Xtandi. No seizures. Denies chest pain, SOB, DOE, abdominal pain, nausea, and vomiting.   Medications: I have reviewed the patient's current medications.  Current Outpatient Prescriptions  Medication Sig Dispense Refill  . Calcium Carbonate-Vitamin D (CALCIUM + D PO) Take by mouth daily.        . enzalutamide (XTANDI) 40 MG capsule Take 4 capsules (160 mg total) by mouth daily.  120 capsule  0  . levothyroxine (SYNTHROID, LEVOTHROID) 75 MCG tablet Take 75 mcg by mouth daily.      .  Naproxen Sodium (ALEVE PO) Take by mouth as needed.         No current facility-administered medications for this visit.     Allergies: No Known Allergies  Past Medical History, Surgical history, Social history, and Family History were reviewed and updated.  Review of Systems:  Remaining ROS negative. Physical Exam: Blood pressure 132/71, pulse 65, temperature 98.1 F (36.7 C), temperature source Oral, resp. rate 18, height 5\' 9"  (1.753 m), weight 222 lb (100.699 kg), SpO2 96.00%. ECOG: 2 General appearance: alert awake and not in any distress. Head: Normocephalic, without obvious abnormality, atraumatic Neck: no adenopathy, no carotid bruit, no JVD, supple, symmetrical, trachea midline and thyroid not enlarged, symmetric, no tenderness/mass/nodules Lymph nodes: Cervical, supraclavicular, and axillary nodes normal. Heart:regular rate and rhythm, S1, S2 normal, no murmur, click, rub or gallop Lung:chest clear, no wheezing, rales, normal symmetric air entry Abdomen: soft, non-tender, without masses or organomegaly EXT:no erythema, induration, or nodules   Lab Results: Lab Results  Component Value Date   WBC 9.2 10/06/2013   HGB 12.5* 10/06/2013   HCT 37.5* 10/06/2013   MCV 91.2 10/06/2013   PLT 254 10/06/2013     Chemistry      Component Value Date/Time   NA 142 10/06/2013 1525   NA 139 03/15/2010 0425   K 4.0 10/06/2013 1525   K 4.1 03/15/2010 0425   CL 107 11/18/2012 1346   CL 104 03/15/2010 0425   CO2 27 10/06/2013 1525   CO2 30 03/15/2010 0425   BUN 12.2 10/06/2013 1525   BUN 12 03/15/2010 0425  CREATININE 0.8 10/06/2013 1525   CREATININE 0.90 03/15/2010 0425      Component Value Date/Time   CALCIUM 9.0 10/06/2013 1525   CALCIUM 8.6 03/15/2010 0425   ALKPHOS 52 10/06/2013 1525   ALKPHOS 53 12/18/2009 1420   AST 11 10/06/2013 1525   AST 17 12/18/2009 1420   ALT <6 10/06/2013 1525   ALT 12 12/18/2009 1420   BILITOT 0.38 10/06/2013 1525   BILITOT <0.1* 12/18/2009 1420      Results for Stephen English, Stephen English (MRN [id number]) as of 10/06/2013 15:56  Ref. Range 02/10/2013 13:16 06/20/2013 14:51 08/25/2013 12:49  PSA Latest Range: <=4.00 ng/mL 55.35 (H) 114.90 (H) 12.73 (H)     Impression and Plan:  80 year old gentleman with the following issues:  1. Castration resistant prostate cancer with metastatic disease to the bone. He is currently on Xtandi and tolerating well. His PSA dropped dramatically from 114.9 down to 12.73. He is agreeable to continue on the current medication and schedule. His quality of life and performance status remained stable and reasonable. He understands even this the hallucinations and her dreams are related to the [facility] he is willing to stay on it for the time being.  2. Bone health: Continue Xgeva at [facility].  3. Androgen depravation he is status post orchiectomy.  4. Anxiety/vivid dreams: Unlikely due to [place], but I have instructed the patient to monitor this and to contact us if these symptoms worsen.  5. Followup: 4-5 weeks.   Wyatt Portela 4/17/20154:12 PM

## 2013-10-07 LAB — PSA: PSA: 8.57 ng/mL — ABNORMAL HIGH (ref <=4.00)

## 2013-10-25 ENCOUNTER — Other Ambulatory Visit: Payer: Self-pay | Admitting: Medical Oncology

## 2013-10-25 ENCOUNTER — Telehealth: Payer: Self-pay | Admitting: *Deleted

## 2013-10-25 DIAGNOSIS — C61 Malignant neoplasm of prostate: Secondary | ICD-10-CM

## 2013-10-25 DIAGNOSIS — C7951 Secondary malignant neoplasm of bone: Secondary | ICD-10-CM

## 2013-10-25 MED ORDER — ENZALUTAMIDE 40 MG PO CAPS: 160.0000 mg | ORAL_CAPSULE | Freq: Every day | ORAL | Status: DC

## 2013-10-25 NOTE — Telephone Encounter (Signed)
Prescription refill for Xtandi faxed to [facility] @ [phone] 

## 2013-10-25 NOTE — Telephone Encounter (Signed)
[facility] faxed Xtandi refill request.  Request to provider's desk/in-basket for review. 

## 2013-11-10 ENCOUNTER — Other Ambulatory Visit (HOSPITAL_BASED_OUTPATIENT_CLINIC_OR_DEPARTMENT_OTHER): Payer: Medicare Other

## 2013-11-10 ENCOUNTER — Encounter: Payer: Self-pay | Admitting: Oncology

## 2013-11-10 ENCOUNTER — Ambulatory Visit (HOSPITAL_BASED_OUTPATIENT_CLINIC_OR_DEPARTMENT_OTHER): Payer: Medicare Other | Admitting: Oncology

## 2013-11-10 ENCOUNTER — Telehealth: Payer: Self-pay | Admitting: Oncology

## 2013-11-10 VITALS — BP 133/66 | HR 52 | Temp 97.2°F | Resp 20 | Ht 69.0 in | Wt 219.6 lb

## 2013-11-10 DIAGNOSIS — C61 Malignant neoplasm of prostate: Secondary | ICD-10-CM

## 2013-11-10 DIAGNOSIS — F411 Generalized anxiety disorder: Secondary | ICD-10-CM

## 2013-11-10 DIAGNOSIS — C7951 Secondary malignant neoplasm of bone: Secondary | ICD-10-CM

## 2013-11-10 DIAGNOSIS — C7952 Secondary malignant neoplasm of bone marrow: Secondary | ICD-10-CM

## 2013-11-10 LAB — COMPREHENSIVE METABOLIC PANEL (CC13)
ALT: 6 U/L (ref 0–55)
ANION GAP: 10 meq/L (ref 3–11)
AST: 12 U/L (ref 5–34)
Albumin: 3.1 g/dL — ABNORMAL LOW (ref 3.5–5.0)
Alkaline Phosphatase: 47 U/L (ref 40–150)
BILIRUBIN TOTAL: 0.37 mg/dL (ref 0.20–1.20)
BUN: 13.5 mg/dL (ref 7.0–26.0)
CHLORIDE: 106 meq/L (ref 98–109)
CO2: 25 mEq/L (ref 22–29)
Calcium: 8.6 mg/dL (ref 8.4–10.4)
Creatinine: 0.9 mg/dL (ref 0.7–1.3)
Glucose: 121 mg/dl (ref 70–140)
Potassium: 3.9 mEq/L (ref 3.5–5.1)
Sodium: 141 mEq/L (ref 136–145)
TOTAL PROTEIN: 6.7 g/dL (ref 6.4–8.3)

## 2013-11-10 LAB — CBC WITH DIFFERENTIAL/PLATELET
BASO%: 1.3 % (ref 0.0–2.0)
Basophils Absolute: 0.1 10*3/uL (ref 0.0–0.1)
EOS%: 4.6 % (ref 0.0–7.0)
Eosinophils Absolute: 0.3 10*3/uL (ref 0.0–0.5)
HCT: 37.8 % — ABNORMAL LOW (ref 38.4–49.9)
HGB: 12.4 g/dL — ABNORMAL LOW (ref 13.0–17.1)
LYMPH%: 25.3 % (ref 14.0–49.0)
MCH: 30.8 pg (ref 27.2–33.4)
MCHC: 32.7 g/dL (ref 32.0–36.0)
MCV: 94 fL (ref 79.3–98.0)
MONO#: 0.5 10*3/uL (ref 0.1–0.9)
MONO%: 7.3 % (ref 0.0–14.0)
NEUT#: 4 10*3/uL (ref 1.5–6.5)
NEUT%: 61.5 % (ref 39.0–75.0)
Platelets: 274 10*3/uL (ref 140–400)
RBC: 4.02 10*6/uL — ABNORMAL LOW (ref 4.20–5.82)
RDW: 15 % — ABNORMAL HIGH (ref 11.0–14.6)
WBC: 6.5 10*3/uL (ref 4.0–10.3)
lymph#: 1.7 10*3/uL (ref 0.9–3.3)

## 2013-11-10 NOTE — Progress Notes (Signed)
Hematology and Oncology Follow Up Visit  Stephen English [id number] 1924-07-25 80 y.o. 11/10/2013 3:15 PM   Principle Diagnosis: 80 year old gentleman with prostate cancer diagnosed in 2011 presented with obstructive uropathy and advanced disease to the bone. His PSA was 335 and his Gleason score was 4+5 equals 9.   Prior Therapy:  He is status post bilateral orchiectomy done in June of 2011 with an excellent PSA response. His PSA went to 62 in September of 2012 and Casodex was added with the PSA dropping down to 10.9. Most recently his PSA went up to 18 in May of 2014 and a repeat bone scan in 11/11/2012 showed essentially unchanged bony metastasis.  Current therapy: Started Xtandi 160 mg daily in January 2015.Marland Kitchen He is on Xgeva given at [facility] urology.  Interim History:  Stephen English presents today for a followup visit. 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 Since his last visit, he continues to be completely asymptomatic. He is not reporting any change in his urination or his performance status. His appetite is excellent and performance status although limited but practically unchanged. He has not reported any hospitalization or illnesses. Has not reported any pathological fractures or recurrent infections.He reports that he is having occasional hallucinations unchanged from previous reports. Vivid dream are better. It is unclear whether these symptoms started well before the start of Xtandi. No seizures. Denies chest pain, SOB, DOE, abdominal pain, nausea, and vomiting.   Medications: I have reviewed the patient's current medications.  Current Outpatient Prescriptions  Medication Sig Dispense Refill  . Calcium Carbonate-Vitamin D (CALCIUM + D PO) Take by mouth daily.        . enzalutamide (XTANDI) 40 MG capsule Take 4 capsules (160 mg total) by mouth daily.  120 capsule  0  . levothyroxine (SYNTHROID, LEVOTHROID) 75 MCG  tablet Take 75 mcg by mouth daily.      . Naproxen Sodium (ALEVE PO) Take by mouth as needed.         No current facility-administered medications for this visit.     Allergies: No Known Allergies  Past Medical History, Surgical history, Social history, and Family History were reviewed and updated.  Review of Systems:  Remaining ROS negative. Physical Exam: Blood pressure 133/66, pulse 52, temperature 97.2 F (36.2 C), temperature source Oral, resp. rate 20, height 5\' 9"  (1.753 m), weight 219 lb 9.6 oz (99.61 kg). ECOG: 2 General appearance: alert awake and not in any distress. Head: Normocephalic, without obvious abnormality, atraumatic Neck: no adenopathy, no carotid bruit, no JVD, supple, symmetrical, trachea midline and thyroid not enlarged, symmetric, no tenderness/mass/nodules Lymph nodes: Cervical, supraclavicular, and axillary nodes normal. Heart:regular rate and rhythm, S1, S2 normal, no murmur, click, rub or gallop Lung:chest clear, no wheezing, rales, normal symmetric air entry Abdomen: soft, non-tender, without masses or organomegaly EXT:no erythema, induration, or nodules   Lab Results: Lab Results  Component Value Date   WBC 6.5 11/10/2013   HGB 12.4* 11/10/2013   HCT 37.8* 11/10/2013   MCV 94.0 11/10/2013   PLT 274 11/10/2013     Chemistry      Component Value Date/Time   NA 141 11/10/2013 1423   NA 139 03/15/2010 0425   K 3.9 11/10/2013 1423   K 4.1 03/15/2010 0425   CL 107 11/18/2012 1346   CL 104 03/15/2010 0425   CO2 25 11/10/2013 1423   CO2 30 03/15/2010 0425   BUN 13.5 11/10/2013 1423  BUN 12 03/15/2010 0425   CREATININE 0.9 11/10/2013 1423   CREATININE 0.90 03/15/2010 0425      Component Value Date/Time   CALCIUM 8.6 11/10/2013 1423   CALCIUM 8.6 03/15/2010 0425   ALKPHOS 47 11/10/2013 1423   ALKPHOS 53 12/18/2009 1420   AST 12 11/10/2013 1423   AST 17 12/18/2009 1420   ALT 6 11/10/2013 1423   ALT 12 12/18/2009 1420   BILITOT 0.37 11/10/2013 1423   BILITOT  <0.1* 12/18/2009 1420     Results for Stephen English, Stephen English (MRN [id number]) as of 11/10/2013 14:44  Ref. Range 11/18/2012 13:46 02/10/2013 13:16 06/20/2013 14:51 08/25/2013 12:49 10/06/2013 15:25  PSA Latest Range: <=4.00 ng/mL 19.64 (H) 55.35 (H) 114.90 (H) 12.73 (H) 8.57 (H)    Impression and Plan:  80 year old gentleman with the following issues:  1. Castration resistant prostate cancer with metastatic disease to the bone. He is currently on Xtandi and tolerating well. His PSA dropped dramatically from 114.9 down to 8.57. He is agreeable to continue on the current medication and schedule. His quality of life and performance status remained stable and reasonable. He understands even this the hallucinations and her dreams are related to the [facility] he is willing to stay on it for the time being.  2. Bone health: Continue Xgeva at [facility].  3. Androgen depravation he is status post orchiectomy.  4. Anxiety/vivid dreams: Unlikely due to [place], but I have instructed the patient to monitor this and to contact us if these symptoms worsen.  5. Followup: 4-5 weeks.   Maryanna Shape 5/22/20153:15 PM

## 2013-11-10 NOTE — Telephone Encounter (Signed)
, °

## 2013-11-10 NOTE — Patient Instructions (Signed)
Results for RONDEL, EPISCOPO (MRN [id number]) as of 11/10/2013 14:44  Ref. Range 11/18/2012 13:46 02/10/2013 13:16 06/20/2013 14:51 08/25/2013 12:49 10/06/2013 15:25  PSA Latest Range: <=4.00 ng/mL 19.64 (H) 55.35 (H) 114.90 (H) 12.73 (H) 8.57 (H)

## 2013-11-11 LAB — PSA: PSA: 7.43 ng/mL — AB (ref <=4.00)

## 2013-11-28 ENCOUNTER — Other Ambulatory Visit: Payer: Self-pay | Admitting: *Deleted

## 2013-11-28 DIAGNOSIS — C61 Malignant neoplasm of prostate: Secondary | ICD-10-CM

## 2013-11-28 DIAGNOSIS — C7951 Secondary malignant neoplasm of bone: Secondary | ICD-10-CM

## 2013-11-28 MED ORDER — ENZALUTAMIDE 40 MG PO CAPS: 160.0000 mg | ORAL_CAPSULE | Freq: Every day | ORAL | Status: DC

## 2013-11-28 NOTE — Telephone Encounter (Signed)
THIS REFILL REQUEST FOR XTANDI WAS PLACED IN DR.SHADAD'S ACTIVE WORK FOLDER. 

## 2013-11-29 NOTE — Telephone Encounter (Signed)
[employer] faxed confirmation of facsimile receipt for [facility] prescription referral.  [employer] will verify insurance and make delivery arrangements with patient.

## 2013-12-01 NOTE — Telephone Encounter (Signed)
RECEIVED A FAX FROM [employer] CONCERNING A CONFIRMATION OF PRESCRIPTION SHIPMENT FOR XTANDI ON 11/30/13.

## 2013-12-14 ENCOUNTER — Encounter: Payer: Self-pay | Admitting: Oncology

## 2013-12-14 ENCOUNTER — Ambulatory Visit (HOSPITAL_BASED_OUTPATIENT_CLINIC_OR_DEPARTMENT_OTHER): Payer: Medicare Other | Admitting: Oncology

## 2013-12-14 ENCOUNTER — Other Ambulatory Visit (HOSPITAL_BASED_OUTPATIENT_CLINIC_OR_DEPARTMENT_OTHER): Payer: Medicare Other

## 2013-12-14 ENCOUNTER — Telehealth: Payer: Self-pay | Admitting: Oncology

## 2013-12-14 VITALS — BP 135/57 | HR 70 | Temp 98.3°F | Resp 16 | Ht 69.0 in

## 2013-12-14 DIAGNOSIS — E291 Testicular hypofunction: Secondary | ICD-10-CM

## 2013-12-14 DIAGNOSIS — C7952 Secondary malignant neoplasm of bone marrow: Secondary | ICD-10-CM

## 2013-12-14 DIAGNOSIS — C7951 Secondary malignant neoplasm of bone: Secondary | ICD-10-CM

## 2013-12-14 DIAGNOSIS — C61 Malignant neoplasm of prostate: Secondary | ICD-10-CM

## 2013-12-14 DIAGNOSIS — F411 Generalized anxiety disorder: Secondary | ICD-10-CM

## 2013-12-14 LAB — CBC WITH DIFFERENTIAL/PLATELET
BASO%: 0.7 % (ref 0.0–2.0)
BASOS ABS: 0.1 10*3/uL (ref 0.0–0.1)
EOS%: 3.3 % (ref 0.0–7.0)
Eosinophils Absolute: 0.3 10*3/uL (ref 0.0–0.5)
HEMATOCRIT: 38.3 % — AB (ref 38.4–49.9)
HEMOGLOBIN: 12.9 g/dL — AB (ref 13.0–17.1)
LYMPH#: 1.8 10*3/uL (ref 0.9–3.3)
LYMPH%: 19.9 % (ref 14.0–49.0)
MCH: 30.8 pg (ref 27.2–33.4)
MCHC: 33.7 g/dL (ref 32.0–36.0)
MCV: 91.4 fL (ref 79.3–98.0)
MONO#: 0.7 10*3/uL (ref 0.1–0.9)
MONO%: 7.3 % (ref 0.0–14.0)
NEUT#: 6.1 10*3/uL (ref 1.5–6.5)
NEUT%: 68.8 % (ref 39.0–75.0)
Platelets: 269 10*3/uL (ref 140–400)
RBC: 4.19 10*6/uL — ABNORMAL LOW (ref 4.20–5.82)
RDW: 14.3 % (ref 11.0–14.6)
WBC: 8.9 10*3/uL (ref 4.0–10.3)

## 2013-12-14 LAB — COMPREHENSIVE METABOLIC PANEL (CC13)
ALK PHOS: 53 U/L (ref 40–150)
ALT: 8 U/L (ref 0–55)
AST: 12 U/L (ref 5–34)
Albumin: 3.1 g/dL — ABNORMAL LOW (ref 3.5–5.0)
Anion Gap: 9 mEq/L (ref 3–11)
BILIRUBIN TOTAL: 0.4 mg/dL (ref 0.20–1.20)
BUN: 12.7 mg/dL (ref 7.0–26.0)
CALCIUM: 9.1 mg/dL (ref 8.4–10.4)
CO2: 25 mEq/L (ref 22–29)
Chloride: 106 mEq/L (ref 98–109)
Creatinine: 0.9 mg/dL (ref 0.7–1.3)
Glucose: 127 mg/dl (ref 70–140)
Potassium: 4 mEq/L (ref 3.5–5.1)
Sodium: 140 mEq/L (ref 136–145)
Total Protein: 7 g/dL (ref 6.4–8.3)

## 2013-12-14 NOTE — Progress Notes (Signed)
Hematology and Oncology Follow Up Visit  SOCORRO EBRON [id number] 27-Mar-1925 80 y.o. 12/14/2013 3:40 PM   Principle Diagnosis: 80 year old gentleman with prostate cancer diagnosed in 2011 presented with obstructive uropathy and advanced disease to the bone. His PSA was 335 and his Gleason score was 4+5 equals 9.   Prior Therapy:  He is status post bilateral orchiectomy done in June of 2011 with an excellent PSA response. His PSA went to 21 in September of 2012 and Casodex was added with the PSA dropping down to 10.9. Most recently his PSA went up to 18 in May of 2014 and a repeat bone scan in 11/11/2012 showed essentially unchanged bony metastasis.  Current therapy:  Started Xtandi 160 mg daily in January 2015. He is on Xgeva given at [facility] urology. This will be given at the [facility] in the future  Interim History:  Mr. Lave presents today for a followup visit with his wife. Since his last visit, he continues to be asymptomatic. He is tolerating Xtandi without any new issues. He has not reported any fatigue or tiredness. Has not reported any bone pain. He is not reporting any change in his urination or his performance status. His appetite is excellent and performance status although limited but unchanged. He has not reported any hospitalization or illnesses. Has not reported any pathological fractures or recurrent infections.He reports that he is having occasional hallucinations but no seizures. Denies chest pain, SOB, DOE, abdominal pain, nausea, and vomiting. He has not reported any syncope or falls. Has not reported any chest pain or palpitation. Has not reported any cough or hemoptysis or hematemesis. Rest of the review of systems unremarkable.  Medications: I have reviewed the patient's current medications.  Current Outpatient Prescriptions  Medication Sig Dispense Refill  . Calcium Carbonate-Vitamin D (CALCIUM + D PO) Take by mouth daily.        . enzalutamide  (XTANDI) 40 MG capsule Take 4 capsules (160 mg total) by mouth daily.  120 capsule  0  . levothyroxine (SYNTHROID, LEVOTHROID) 75 MCG tablet Take 75 mcg by mouth daily.      . Naproxen Sodium (ALEVE PO) Take by mouth as needed.         No current facility-administered medications for this visit.     Allergies: No Known Allergies  Past Medical History, Surgical history, Social history, and Family History were reviewed and updated.  Review of Systems:  Remaining ROS negative. Physical Exam: Blood pressure 135/57, pulse 70, temperature 98.3 F (36.8 C), temperature source Oral, resp. rate 16, height 5\' 9"  (1.753 m), weight 0 lb (0 kg), SpO2 95.00%. ECOG: 2 General appearance: alert awake and not in any distress. Head: Normocephalic, without obvious abnormality, atraumatic Neck: no adenopathy Lymph nodes: Cervical, supraclavicular, and axillary nodes normal. Heart:regular rate and rhythm, S1, S2 normal, no murmur, click, rub or gallop Lung:chest clear, no wheezing, rales, normal symmetric air entry Abdomen: soft, non-tender, without masses or organomegaly EXT:no erythema, induration, or nodules   Lab Results: Lab Results  Component Value Date   WBC 8.9 12/14/2013   HGB 12.9* 12/14/2013   HCT 38.3* 12/14/2013   MCV 91.4 12/14/2013   PLT 269 12/14/2013     Chemistry      Component Value Date/Time   NA 141 11/10/2013 1423   NA 139 03/15/2010 0425   K 3.9 11/10/2013 1423   K 4.1 03/15/2010 0425   CL 107 11/18/2012 1346   CL 104 03/15/2010 0425  CO2 25 11/10/2013 1423   CO2 30 03/15/2010 0425   BUN 13.5 11/10/2013 1423   BUN 12 03/15/2010 0425   CREATININE 0.9 11/10/2013 1423   CREATININE 0.90 03/15/2010 0425      Component Value Date/Time   CALCIUM 8.6 11/10/2013 1423   CALCIUM 8.6 03/15/2010 0425   ALKPHOS 47 11/10/2013 1423   ALKPHOS 53 12/18/2009 1420   AST 12 11/10/2013 1423   AST 17 12/18/2009 1420   ALT 6 11/10/2013 1423   ALT 12 12/18/2009 1420   BILITOT 0.37 11/10/2013 1423    BILITOT <0.1* 12/18/2009 1420      Results for ITHAN, TOUHEY (MRN [id number]) as of 12/14/2013 15:30  Ref. Range 08/25/2013 12:49 10/06/2013 15:25 11/10/2013 14:23  PSA Latest Range: <=4.00 ng/mL 12.73 (H) 8.57 (H) 7.43 (H)    Impression and Plan:  80 year old gentleman with the following issues:  1. Castration resistant prostate cancer with metastatic disease to the bone. He is currently on Xtandi and tolerating well. His PSA dropped dramatically from 114.9 down to 7.43. No major side effects noted from this medication at this time. Despite his poor performance status, he has not had any major changes in his quality of life. I plan on to continue Xtandi without any dose reductions or delay.  2. Bone health: He would like to have Xgeva done here with his regular visits. He will be due with his next visit in July of 2015. I have sent a preauthorization request and anticipation of his next injection. He is to continue his calcium supplements. I also educated him about complications related to this medication such as osteonecrosis of the jaw.  3. Androgen depravation he is status post orchiectomy.  4. Anxiety/vivid dreams: Unlikely due to Gillermina Phy has been less of an issue at this time.  5. Followup: 4-5 weeks.   QPRFFM,BWGYK 6/25/20153:40 PM

## 2013-12-14 NOTE — Telephone Encounter (Signed)
gv adn printed appt sched and avs for pt for July  °

## 2013-12-15 LAB — PSA: PSA: 7.04 ng/mL — AB (ref <=4.00)

## 2013-12-25 ENCOUNTER — Other Ambulatory Visit: Payer: Self-pay | Admitting: *Deleted

## 2013-12-25 NOTE — Telephone Encounter (Signed)
THIS REFILL REQUEST FOR XTANDI WAS PLACED IN DR.SHADAD'S ACTIVE WORK FOLDER. 

## 2013-12-26 ENCOUNTER — Other Ambulatory Visit: Payer: Self-pay | Admitting: *Deleted

## 2013-12-26 DIAGNOSIS — C7951 Secondary malignant neoplasm of bone: Secondary | ICD-10-CM

## 2013-12-26 DIAGNOSIS — C61 Malignant neoplasm of prostate: Secondary | ICD-10-CM

## 2013-12-26 MED ORDER — ENZALUTAMIDE 40 MG PO CAPS: 160.0000 mg | ORAL_CAPSULE | Freq: Every day | ORAL | Status: DC

## 2013-12-26 NOTE — Telephone Encounter (Signed)
RECEIVED A FAX FROM [employer] CONCERNING A CONFIRMATION OF FACSIMILE RECEIPT. 

## 2014-01-02 NOTE — Telephone Encounter (Signed)
RECEIVED A FAX FROM [employer] CONCERNING A CONFIRMATION OF PRESCRIPTION SHIPMENT FOR XTANDI ON 01/01/14.

## 2014-01-07 ENCOUNTER — Encounter (HOSPITAL_COMMUNITY): Payer: Self-pay | Admitting: Emergency Medicine

## 2014-01-07 ENCOUNTER — Emergency Department (HOSPITAL_COMMUNITY)
Admission: EM | Admit: 2014-01-07 | Discharge: 2014-01-08 | Disposition: A | Discharge status: Home / Self Care | Payer: Medicare Other | Attending: Emergency Medicine | Admitting: Emergency Medicine

## 2014-01-07 DIAGNOSIS — Y92009 Unspecified place in unspecified non-institutional (private) residence as the place of occurrence of the external cause: Secondary | ICD-10-CM | POA: Insufficient documentation

## 2014-01-07 DIAGNOSIS — R197 Diarrhea, unspecified: Secondary | ICD-10-CM | POA: Insufficient documentation

## 2014-01-07 DIAGNOSIS — Z87891 Personal history of nicotine dependence: Secondary | ICD-10-CM | POA: Insufficient documentation

## 2014-01-07 DIAGNOSIS — R5383 Other fatigue: Principal | ICD-10-CM

## 2014-01-07 DIAGNOSIS — R42 Dizziness and giddiness: Secondary | ICD-10-CM | POA: Insufficient documentation

## 2014-01-07 DIAGNOSIS — R296 Repeated falls: Secondary | ICD-10-CM | POA: Insufficient documentation

## 2014-01-07 DIAGNOSIS — Y9301 Activity, walking, marching and hiking: Secondary | ICD-10-CM | POA: Insufficient documentation

## 2014-01-07 DIAGNOSIS — Z79899 Other long term (current) drug therapy: Secondary | ICD-10-CM | POA: Insufficient documentation

## 2014-01-07 DIAGNOSIS — Z8719 Personal history of other diseases of the digestive system: Secondary | ICD-10-CM | POA: Insufficient documentation

## 2014-01-07 DIAGNOSIS — Z8781 Personal history of (healed) traumatic fracture: Secondary | ICD-10-CM | POA: Insufficient documentation

## 2014-01-07 DIAGNOSIS — Z8546 Personal history of malignant neoplasm of prostate: Secondary | ICD-10-CM | POA: Insufficient documentation

## 2014-01-07 DIAGNOSIS — W19XXXA Unspecified fall, initial encounter: Secondary | ICD-10-CM

## 2014-01-07 DIAGNOSIS — Z043 Encounter for examination and observation following other accident: Secondary | ICD-10-CM | POA: Insufficient documentation

## 2014-01-07 DIAGNOSIS — R531 Weakness: Secondary | ICD-10-CM

## 2014-01-07 DIAGNOSIS — R350 Frequency of micturition: Secondary | ICD-10-CM | POA: Insufficient documentation

## 2014-01-07 DIAGNOSIS — R5381 Other malaise: Secondary | ICD-10-CM | POA: Insufficient documentation

## 2014-01-07 HISTORY — DX: Malignant neoplasm of prostate: C61

## 2014-01-07 LAB — CBG MONITORING, ED: GLUCOSE-CAPILLARY: 176 mg/dL — AB (ref 70–99)

## 2014-01-07 NOTE — ED Notes (Signed)
Pt arrives by EMS, per pt-felt weak in his lower extremities when going to the bathroom-fell to his knees with his walker-using the bathroom during this time and became dizzy and then he proceeded to have increased weakness.  Per EMS, pt has weakness, but increased tonight.  Per EMS, pt stated darker urine in the last few days-difficulty standing and ambulating over the last 2 months.  Pt has prostate cancer-undergoing chemo.

## 2014-01-07 NOTE — ED Provider Notes (Signed)
CSN: [id number]     Arrival date & time 01/07/14  2237 History   First MD Initiated Contact with Patient 01/07/14 2303     Chief Complaint  Patient presents with  . Extremity Weakness    Patient was walking with walker at home and his feet floated off under him.     (Consider location/radiation/quality/duration/timing/severity/associated sxs/prior Treatment) Patient is a 80 y.o. male presenting with fall.  Fall This is a new problem. Episode onset: tonight. Episode frequency: Once. The problem has been resolved. Associated symptoms include chest pain (a few weeks ago, none since). Pertinent negatives include no abdominal pain and no shortness of breath. Associated symptoms comments: Lightheadedness, generalized weakness, no syncope. Nothing aggravates the symptoms. The symptoms are relieved by rest.    Past Medical History  Diagnosis Date  . Broken ankle 2001  . Broken leg 1999  . Ventral hernia   . Cancer     prostate  . Prostate cancer    Past Surgical History  Procedure Laterality Date  . Appendectomy  1948  . Hernia repair  03/14/10    [facility]  . Prostate surgery  12/2009   Family History  Problem Relation Age of Onset  . Cancer Mother     oral   History  Substance Use Topics  . Smoking status: Former Research scientist (life sciences)  . Smokeless tobacco: Not on file     Comment: quit 1987  . Alcohol Use: No    Review of Systems  Constitutional: Negative for fever.  Respiratory: Negative for cough and shortness of breath.   Cardiovascular: Positive for chest pain (a few weeks ago, none since).  Gastrointestinal: Positive for diarrhea (mild). Negative for nausea, vomiting and abdominal pain.  Genitourinary: Positive for frequency. Negative for dysuria.  All other systems reviewed and are negative.     Allergies  Review of patient's allergies indicates no known allergies.  Home Medications   Prior to Admission medications   Medication Sig Start Date End Date Taking? Authorizing  Provider  Calcium Carbonate-Vitamin D (CALCIUM + D PO) Take by mouth daily.      Historical Provider, MD  enzalutamide Gillermina Phy) 40 MG capsule Take 4 capsules (160 mg total) by mouth daily. 12/26/13   Wyatt Portela, MD  levothyroxine (SYNTHROID, LEVOTHROID) 75 MCG tablet Take 75 mcg by mouth daily. 11/03/12   Historical Provider, MD  Naproxen Sodium (ALEVE PO) Take by mouth as needed.      Historical Provider, MD   BP 142/65  Pulse 61  Temp(Src) 98.2 F (36.8 C) (Oral)  Resp 18  SpO2 95% Physical Exam  Nursing note and vitals reviewed. Constitutional: He is oriented to person, place, and time. He appears well-developed and well-nourished. No distress.  HENT:  Head: Normocephalic and atraumatic. Head is without raccoon's eyes and without Battle's sign.  Nose: Nose normal.  Eyes: Conjunctivae and EOM are normal. Pupils are equal, round, and reactive to light. No scleral icterus.  Neck: No spinous process tenderness and no muscular tenderness present.  Cardiovascular: Normal rate, regular rhythm, normal heart sounds and intact distal pulses.   No murmur heard. Pulmonary/Chest: Effort normal and breath sounds normal. He has no rales. He exhibits no tenderness.  Abdominal: Soft. There is no tenderness. There is no rebound and no guarding.  Musculoskeletal: Normal range of motion. He exhibits no edema and no tenderness.       Thoracic back: He exhibits no tenderness and no bony tenderness.       Lumbar  back: He exhibits no tenderness and no bony tenderness.  No evidence of trauma to extremities, except as noted.  2+ distal pulses.    Neurological: He is alert and oriented to person, place, and time. He has normal strength.  Skin: Skin is warm and dry. No rash noted.  Psychiatric: He has a normal mood and affect.    ED Course  Procedures (including critical care time) Labs Review Labs Reviewed  URINALYSIS, ROUTINE W REFLEX MICROSCOPIC - Abnormal; Notable for the following:    Color, Urine  AMBER (*)    APPearance CLOUDY (*)    Hgb urine dipstick LARGE (*)    Leukocytes, UA SMALL (*)    All other components within normal limits  CBC WITH DIFFERENTIAL - Abnormal; Notable for the following:    WBC 11.6 (*)    Hemoglobin 12.7 (*)    Neutrophils Relative % 79 (*)    Neutro Abs 9.2 (*)    Lymphocytes Relative 11 (*)    All other components within normal limits  BASIC METABOLIC PANEL - Abnormal; Notable for the following:    Glucose, Bld 140 (*)    GFR calc non Af Amer 81 (*)    Anion gap 16 (*)    All other components within normal limits  CBG MONITORING, ED - Abnormal; Notable for the following:    Glucose-Capillary 176 (*)    All other components within normal limits  URINE CULTURE  TROPONIN I  URINE MICROSCOPIC-ADD ON    Imaging Review No results found.   EKG Interpretation   Date/Time:  Sunday January 07 2014 22:58:43 EDT Ventricular Rate:  90 PR Interval:  175 QRS Duration: 112 QT Interval:  464 QTC Calculation: 568 R Axis:   63 Text Interpretation:  Sinus rhythm Borderline intraventricular conduction  delay Nonspecific T abnormalities, lateral leads variable rate, otherwise  no significant changes from prior Confirmed by [facility]  MD, TREY ([id number]) on  01/07/2014 11:26:47 PM      MDM   Final diagnoses:  Fall, initial encounter  Generalized weakness    80 yo male with hx of prostate cancer presenting after a fall.  States he has been feeling generalized weakness (without focality) for past month or so.  He has also had some intermittent lightheadedness.  No syncope.  History and exam not consistent with CVA.  He denied any injuries secondary to his fall.  No head trauma, no LOC.  EKG showed a few beats of what appear to be consecutive PACs.  I reviewed this on his telemetry monitoring and although it was caught at the beginning of his EKG, it was a very brief episode.  Labs were unremarkable.  Urine sent for culture, but does not appear to be consistent with  UTI.  He was able to stand and ambulate without dizziness.  He appears stable for outpatient management.  Given return precautions.      Houston Siren III, MD 01/08/14 [phone]

## 2014-01-07 NOTE — ED Notes (Signed)
Patient stated for the past two days he has not been able to urinate and when he does it is just a dribble.

## 2014-01-07 NOTE — ED Notes (Signed)
Bed: WA17 Expected date:  Expected time:  Means of arrival:  Comments: EMS 

## 2014-01-08 LAB — URINALYSIS, ROUTINE W REFLEX MICROSCOPIC
BILIRUBIN URINE: NEGATIVE
Glucose, UA: NEGATIVE mg/dL
Ketones, ur: NEGATIVE mg/dL
NITRITE: NEGATIVE
Protein, ur: NEGATIVE mg/dL
Specific Gravity, Urine: 1.023 (ref 1.005–1.030)
UROBILINOGEN UA: 0.2 mg/dL (ref 0.0–1.0)
pH: 5 (ref 5.0–8.0)

## 2014-01-08 LAB — CBC WITH DIFFERENTIAL/PLATELET
BASOS ABS: 0.1 10*3/uL (ref 0.0–0.1)
Basophils Relative: 0 % (ref 0–1)
EOS ABS: 0.1 10*3/uL (ref 0.0–0.7)
Eosinophils Relative: 1 % (ref 0–5)
HCT: 39 % (ref 39.0–52.0)
Hemoglobin: 12.7 g/dL — ABNORMAL LOW (ref 13.0–17.0)
LYMPHS ABS: 1.2 10*3/uL (ref 0.7–4.0)
Lymphocytes Relative: 11 % — ABNORMAL LOW (ref 12–46)
MCH: 30 pg (ref 26.0–34.0)
MCHC: 32.6 g/dL (ref 30.0–36.0)
MCV: 92 fL (ref 78.0–100.0)
Monocytes Absolute: 1 10*3/uL (ref 0.1–1.0)
Monocytes Relative: 9 % (ref 3–12)
NEUTROS PCT: 79 % — AB (ref 43–77)
Neutro Abs: 9.2 10*3/uL — ABNORMAL HIGH (ref 1.7–7.7)
PLATELETS: 256 10*3/uL (ref 150–400)
RBC: 4.24 MIL/uL (ref 4.22–5.81)
RDW: 14.4 % (ref 11.5–15.5)
WBC: 11.6 10*3/uL — AB (ref 4.0–10.5)

## 2014-01-08 LAB — BASIC METABOLIC PANEL
ANION GAP: 16 — AB (ref 5–15)
BUN: 11 mg/dL (ref 6–23)
CO2: 21 mEq/L (ref 19–32)
Calcium: 8.9 mg/dL (ref 8.4–10.5)
Chloride: 104 mEq/L (ref 96–112)
Creatinine, Ser: 0.73 mg/dL (ref 0.50–1.35)
GFR calc Af Amer: >90 mL/min (ref >90)
GFR calc non Af Amer: 81 mL/min — ABNORMAL LOW (ref >90)
Glucose, Bld: 140 mg/dL — ABNORMAL HIGH (ref 70–99)
Potassium: 4.9 mEq/L (ref 3.7–5.3)
SODIUM: 141 meq/L (ref 137–147)

## 2014-01-08 LAB — URINE MICROSCOPIC-ADD ON

## 2014-01-08 LAB — TROPONIN I

## 2014-01-08 NOTE — Discharge Instructions (Signed)
Fall Prevention and Home Safety Falls cause injuries and can affect all age groups. It is possible to use preventive measures to significantly decrease the likelihood of falls. There are many simple measures which can make your home safer and prevent falls. OUTDOORS  Repair cracks and edges of walkways and driveways.  Remove high doorway thresholds.  Trim shrubbery on the main path into your home.  Have good outside lighting.  Clear walkways of tools, rocks, debris, and clutter.  Check that handrails are not broken and are securely fastened. Both sides of steps should have handrails.  Have leaves, snow, and ice cleared regularly.  Use sand or salt on walkways during winter months.  In the garage, clean up grease or oil spills. BATHROOM  Install night lights.  Install grab bars by the toilet and in the tub and shower.  Use non-skid mats or decals in the tub or shower.  Place a plastic non-slip stool in the shower to sit on, if needed.  Keep floors dry and clean up all water on the floor immediately.  Remove soap buildup in the tub or shower on a regular basis.  Secure bath mats with non-slip, double-sided rug tape.  Remove throw rugs and tripping hazards from the floors. BEDROOMS  Install night lights.  Make sure a bedside light is easy to reach.  Do not use oversized bedding.  Keep a telephone by your bedside.  Have a firm chair with side arms to use for getting dressed.  Remove throw rugs and tripping hazards from the floor. KITCHEN  Keep handles on pots and pans turned toward the center of the stove. Use back burners when possible.  Clean up spills quickly and allow time for drying.  Avoid walking on wet floors.  Avoid hot utensils and knives.  Position shelves so they are not too high or low.  Place commonly used objects within easy reach.  If necessary, use a sturdy step stool with a grab bar when reaching.  Keep electrical cables out of the  way.  Do not use floor polish or wax that makes floors slippery. If you must use wax, use non-skid floor wax.  Remove throw rugs and tripping hazards from the floor. STAIRWAYS  Never leave objects on stairs.  Place handrails on both sides of stairways and use them. Fix any loose handrails. Make sure handrails on both sides of the stairways are as long as the stairs.  Check carpeting to make sure it is firmly attached along stairs. Make repairs to worn or loose carpet promptly.  Avoid placing throw rugs at the top or bottom of stairways, or properly secure the rug with carpet tape to prevent slippage. Get rid of throw rugs, if possible.  Have an electrician put in a light switch at the top and bottom of the stairs. OTHER FALL PREVENTION TIPS  Wear low-heel or rubber-soled shoes that are supportive and fit well. Wear closed toe shoes.  When using a stepladder, make sure it is fully opened and both spreaders are firmly locked. Do not climb a closed stepladder.  Add color or contrast paint or tape to grab bars and handrails in your home. Place contrasting color strips on first and last steps.  Learn and use mobility aids as needed. Install an electrical emergency response system.  Turn on lights to avoid dark areas. Replace light bulbs that burn out immediately. Get light switches that glow.  Arrange furniture to create clear pathways. Keep furniture in the same place.  Firmly attach carpet with non-skid or double-sided tape.  Eliminate uneven floor surfaces.  Select a carpet pattern that does not visually hide the edge of steps.  Be aware of all pets. OTHER HOME SAFETY TIPS  Set the water temperature for 120 F (48.8 C).  Keep emergency numbers on or near the telephone.  Keep smoke detectors on every level of the home and near sleeping areas. Document Released: 05/29/2002 Document Revised: 12/08/2011 Document Reviewed: 08/28/2011 [facility] Patient Information 2015  [place], [place]. This information is not intended to replace advice given to you by your health care provider. Make sure you discuss any questions you have with your health care provider.  Fatigue Fatigue is a feeling of tiredness, lack of energy, lack of motivation, or feeling tired all the time. Having enough rest, good nutrition, and reducing stress will normally reduce fatigue. Consult your caregiver if it persists. The nature of your fatigue will help your caregiver to find out its cause. The treatment is based on the cause.  CAUSES  There are many causes for fatigue. Most of the time, fatigue can be traced to one or more of your habits or routines. Most causes fit into one or more of three general areas. They are: Lifestyle problems  Sleep disturbances.  Overwork.  Physical exertion.  Unhealthy habits.  Poor eating habits or eating disorders.  Alcohol and/or drug use .  Lack of proper nutrition (malnutrition). Psychological problems  Stress and/or anxiety problems.  Depression.  Grief.  Boredom. Medical Problems or Conditions  Anemia.  Pregnancy.  Thyroid gland problems.  Recovery from major surgery.  Continuous pain.  Emphysema or asthma that is not well controlled  Allergic conditions.  Diabetes.  Infections (such as mononucleosis).  Obesity.  Sleep disorders, such as sleep apnea.  Heart failure or other heart-related problems.  Cancer.  Kidney disease.  Liver disease.  Effects of certain medicines such as antihistamines, cough and cold remedies, prescription pain medicines, heart and blood pressure medicines, drugs used for treatment of cancer, and some antidepressants. SYMPTOMS  The symptoms of fatigue include:   Lack of energy.  Lack of drive (motivation).  Drowsiness.  Feeling of indifference to the surroundings. DIAGNOSIS  The details of how you feel help guide your caregiver in finding out what is causing the fatigue. You will be  asked about your present and past health condition. It is important to review all medicines that you take, including prescription and non-prescription items. A thorough exam will be done. You will be questioned about your feelings, habits, and normal lifestyle. Your caregiver may suggest blood tests, urine tests, or other tests to look for common medical causes of fatigue.  TREATMENT  Fatigue is treated by correcting the underlying cause. For example, if you have continuous pain or depression, treating these causes will improve how you feel. Similarly, adjusting the dose of certain medicines will help in reducing fatigue.  HOME CARE INSTRUCTIONS   Try to get the required amount of good sleep every night.  Eat a healthy and nutritious diet, and drink enough water throughout the day.  Practice ways of relaxing (including yoga or meditation).  Exercise regularly.  Make plans to change situations that cause stress. Act on those plans so that stresses decrease over time. Keep your work and personal routine reasonable.  Avoid street drugs and minimize use of alcohol.  Start taking a daily multivitamin after consulting your caregiver. SEEK MEDICAL CARE IF:   You have persistent tiredness, which cannot be  accounted for.  You have fever.  You have unintentional weight loss.  You have headaches.  You have disturbed sleep throughout the night.  You are feeling sad.  You have constipation.  You have dry skin.  You have gained weight.  You are taking any new or different medicines that you suspect are causing fatigue.  You are unable to sleep at night.  You develop any unusual swelling of your legs or other parts of your body. SEEK IMMEDIATE MEDICAL CARE IF:   You are feeling confused.  Your vision is blurred.  You feel faint or pass out.  You develop severe headache.  You develop severe abdominal, pelvic, or back pain.  You develop chest pain, shortness of breath, or an  irregular or fast heartbeat.  You are unable to pass a normal amount of urine.  You develop abnormal bleeding such as bleeding from the rectum or you vomit blood.  You have thoughts about harming yourself or committing suicide.  You are worried that you might harm someone else. MAKE SURE YOU:   Understand these instructions.  Will watch your condition.  Will get help right away if you are not doing well or get worse. Document Released: 04/05/2007 Document Revised: 08/31/2011 Document Reviewed: 04/05/2007 [facility] Patient Information 2015 [place], [place]. This information is not intended to replace advice given to you by your health care provider. Make sure you discuss any questions you have with your health care provider.

## 2014-01-08 NOTE — ED Notes (Signed)
Patient walked without difficulty with two person assistance. Patient was hesitant due to being scared from previous fall.

## 2014-01-09 ENCOUNTER — Encounter: Payer: Self-pay | Admitting: *Deleted

## 2014-01-09 NOTE — Progress Notes (Signed)
Patient calling to say he fell July 19th, went to [facility] No fx. Is currently in a wheel chair and using walker and cane. Wants to cancel appt for 01/11/14 for lab/adrena/injection. Note to dr Alen Blew and Burnetta Sabin. appt cancelled. Patient will call us back to r/s when he recovers.

## 2014-01-10 LAB — URINE CULTURE

## 2014-01-11 ENCOUNTER — Ambulatory Visit: Payer: Medicare Other

## 2014-01-11 ENCOUNTER — Other Ambulatory Visit: Payer: Medicare Other

## 2014-01-11 ENCOUNTER — Ambulatory Visit: Payer: Medicare Other | Admitting: Physician Assistant

## 2014-01-24 ENCOUNTER — Other Ambulatory Visit: Payer: Self-pay | Admitting: *Deleted

## 2014-01-24 DIAGNOSIS — C7951 Secondary malignant neoplasm of bone: Secondary | ICD-10-CM

## 2014-01-24 DIAGNOSIS — C61 Malignant neoplasm of prostate: Secondary | ICD-10-CM

## 2014-01-24 MED ORDER — ENZALUTAMIDE 40 MG PO CAPS: 160.0000 mg | ORAL_CAPSULE | Freq: Every day | ORAL | Status: DC

## 2014-01-25 ENCOUNTER — Other Ambulatory Visit: Payer: Self-pay | Admitting: *Deleted

## 2014-01-25 ENCOUNTER — Encounter: Payer: Self-pay | Admitting: *Deleted

## 2014-01-25 DIAGNOSIS — C7951 Secondary malignant neoplasm of bone: Secondary | ICD-10-CM

## 2014-01-25 DIAGNOSIS — C61 Malignant neoplasm of prostate: Secondary | ICD-10-CM

## 2014-01-25 NOTE — Progress Notes (Signed)
[place] Work  Clinical Social Work was referred by Therapist, sports for transportation concerns.  Clinical Social Worker contacted patient by phone to offer support and assess for needs.  Mr. Redwine reports trouble ambulating and much difficulty getting in and out of car.  [place] and [place] qualify for [place] mobility services.  CSW encouraged patient to contact his insurance provider to determine if he has medical transportation benefits.  CSW and patient also discussed SNF rehab placement.  Mr. Brookens does not feel he needs that level of service at this time.  The patient also has two sons in the area that can help with transportation.  CSW encouraged patient to utilize family support as well and discussed importance of attending medical visits to continue receiving treatment.   Polo Riley, MSW, LCSW, OSW-C Clinical Social Worker [facility] [phone]

## 2014-01-25 NOTE — Progress Notes (Signed)
Order e-scribed to advanced for P.T. Assessment and evaluation for ambulation and weight bearing exercises. Patient notified, they will be calling him to set up an appt. Patient verbalizes understanding.

## 2014-01-30 NOTE — Telephone Encounter (Signed)
RECEIVED A FAX FROM [employer] CONCERNING A CONFIRMATION OF PRESCRIPTION SHIPMENT FOR XTANDI ON 01/29/14.

## 2014-02-02 ENCOUNTER — Telehealth: Payer: Self-pay | Admitting: Medical Oncology

## 2014-02-02 NOTE — Telephone Encounter (Signed)
Patient called with concern of not having transportation to come for office appts. Per patient his sons are not in a position to help him and when asked about his neighbors, he states that they are older with medical problems as well and does not feel that he can rely on them either. Asked patient whether he contacted his insurance to see if there is any coverage for transportation, he states there is not.   Informed patient will contact SW again to see if there are any other options to be able to help him get to the clinic and will contact him next week. Patient states if there are no options, he will be forced to stop treatment. Patient knows to expect call from clinic.  Mssg forwarded to Loleta Dicker and MD

## 2014-02-05 ENCOUNTER — Encounter: Payer: Self-pay | Admitting: *Deleted

## 2014-02-05 NOTE — Progress Notes (Signed)
[place] Work  Holiday representative received referral from Therapist, sports for transportation needs.  CSW contacted patient at home to offer support and assess for needs.  Patient stated his family members and community members were providing transportation, but due to his increased needs and difficulty ambulating they have been unable to continue providing transportation.  CSW and patient reviewed the limited resources available; as discussed by CSW Mullis previously.  Most transportation resources would require out of pocket cost; which would be very difficult for patient.  CSW informed patient of [facility] and mobility services.  The application requires patients signature.  CSW will mail patient the application to sign.  CSW also faxed the application to [employer] indicating why patient was unable to sign the document.  CSW encouraged pt to call with any questions or concerns.  CSW will also follow up with [employer] regarding the application.    Johnnye Lana, MSW, LCSW, OSW-C Clinical Social Worker [facility] [phone]

## 2014-02-09 ENCOUNTER — Telehealth: Payer: Self-pay | Admitting: Physician Assistant

## 2014-02-09 NOTE — Telephone Encounter (Signed)
Pt cld to r/s 07/23 lab/ov due to transportation.Marland Kitchen..KJ

## 2014-02-15 ENCOUNTER — Other Ambulatory Visit: Payer: Self-pay | Admitting: Physician Assistant

## 2014-02-15 ENCOUNTER — Other Ambulatory Visit (HOSPITAL_BASED_OUTPATIENT_CLINIC_OR_DEPARTMENT_OTHER): Payer: Medicare Other

## 2014-02-15 ENCOUNTER — Telehealth: Payer: Self-pay | Admitting: Oncology

## 2014-02-15 ENCOUNTER — Encounter: Payer: Self-pay | Admitting: Physician Assistant

## 2014-02-15 ENCOUNTER — Ambulatory Visit (HOSPITAL_BASED_OUTPATIENT_CLINIC_OR_DEPARTMENT_OTHER): Payer: Medicare Other

## 2014-02-15 ENCOUNTER — Ambulatory Visit (HOSPITAL_BASED_OUTPATIENT_CLINIC_OR_DEPARTMENT_OTHER): Payer: Medicare Other | Admitting: Physician Assistant

## 2014-02-15 VITALS — BP 152/74 | HR 52 | Temp 98.2°F | Resp 19 | Ht 69.0 in

## 2014-02-15 DIAGNOSIS — C61 Malignant neoplasm of prostate: Secondary | ICD-10-CM

## 2014-02-15 DIAGNOSIS — C7952 Secondary malignant neoplasm of bone marrow: Secondary | ICD-10-CM

## 2014-02-15 DIAGNOSIS — C7951 Secondary malignant neoplasm of bone: Secondary | ICD-10-CM

## 2014-02-15 DIAGNOSIS — E291 Testicular hypofunction: Secondary | ICD-10-CM

## 2014-02-15 LAB — COMPREHENSIVE METABOLIC PANEL (CC13)
ALBUMIN: 3 g/dL — AB (ref 3.5–5.0)
ALT: 8 U/L (ref 0–55)
AST: 14 U/L (ref 5–34)
Alkaline Phosphatase: 53 U/L (ref 40–150)
Anion Gap: 9 mEq/L (ref 3–11)
BUN: 12 mg/dL (ref 7.0–26.0)
CALCIUM: 8.9 mg/dL (ref 8.4–10.4)
CHLORIDE: 103 meq/L (ref 98–109)
CO2: 28 mEq/L (ref 22–29)
CREATININE: 0.7 mg/dL (ref 0.7–1.3)
Glucose: 106 mg/dl (ref 70–140)
POTASSIUM: 4 meq/L (ref 3.5–5.1)
Sodium: 140 mEq/L (ref 136–145)
Total Bilirubin: 0.41 mg/dL (ref 0.20–1.20)
Total Protein: 6.9 g/dL (ref 6.4–8.3)

## 2014-02-15 LAB — CBC WITH DIFFERENTIAL/PLATELET
BASO%: 1.1 % (ref 0.0–2.0)
Basophils Absolute: 0.1 10*3/uL (ref 0.0–0.1)
EOS%: 6.4 % (ref 0.0–7.0)
Eosinophils Absolute: 0.4 10*3/uL (ref 0.0–0.5)
HCT: 38.2 % — ABNORMAL LOW (ref 38.4–49.9)
HGB: 12.6 g/dL — ABNORMAL LOW (ref 13.0–17.1)
LYMPH#: 1.7 10*3/uL (ref 0.9–3.3)
LYMPH%: 27.2 % (ref 14.0–49.0)
MCH: 30.5 pg (ref 27.2–33.4)
MCHC: 33 g/dL (ref 32.0–36.0)
MCV: 92.5 fL (ref 79.3–98.0)
MONO#: 0.6 10*3/uL (ref 0.1–0.9)
MONO%: 8.8 % (ref 0.0–14.0)
NEUT#: 3.5 10*3/uL (ref 1.5–6.5)
NEUT%: 56.5 % (ref 39.0–75.0)
Platelets: 286 10*3/uL (ref 140–400)
RBC: 4.13 10*6/uL — ABNORMAL LOW (ref 4.20–5.82)
RDW: 14.9 % — AB (ref 11.0–14.6)
WBC: 6.3 10*3/uL (ref 4.0–10.3)

## 2014-02-15 MED ORDER — DENOSUMAB 120 MG/1.7ML ~~LOC~~ SOLN
120.0000 mg | Freq: Once | SUBCUTANEOUS | Status: AC
  Administered 2014-02-15: 120 mg via SUBCUTANEOUS
  Filled 2014-02-15: qty 1.7

## 2014-02-15 NOTE — Progress Notes (Signed)
Hematology and Oncology Follow Up Visit  Stephen English [id number] 18-Sep-1924 80 y.o. 02/15/2014 5:02 PM   Principle Diagnosis: 80 year old gentleman with prostate cancer diagnosed in 2011 presented with obstructive uropathy and advanced disease to the bone. His PSA was 335 and his Gleason score was 4+5 equals 9.   Prior Therapy:  He is status post bilateral orchiectomy done in June of 2011 with an excellent PSA response. His PSA went to 73 in September of 2012 and Casodex was added with the PSA dropping down to 10.9. Most recently his PSA went up to 18 in May of 2014 and a repeat bone scan in 11/11/2012 showed essentially unchanged bony metastasis.  Current therapy:  Started Xtandi 160 mg daily in January 2015. He is on Xgeva given at [facility]. This will be given at the [facility] in the future  Interim History:  Stephen English presents today for a followup visit with his wife. Since his last visit, he continues to be asymptomatic. He is tolerating Xtandi without any new issues. He complains of discomfort in his left hip and other joints. He reports some issues with transportation. He requests fewer visits if possible due to issues with finances and transportation.He has not reported any fatigue or tiredness.  He is not reporting any change in his urination or his performance status. His appetite is excellent and performance status although limited but unchanged. He has not reported any hospitalization or illnesses. Has not reported any pathological fractures or recurrent infections. Denies chest pain, SOB, DOE, abdominal pain, nausea, and vomiting. He has not reported any syncope or falls. Has not reported any chest pain or palpitation. Has not reported any cough or hemoptysis or hematemesis. Remainder of the review of systems unremarkable.  Medications: I have reviewed the patient's current medications.  Current Outpatient Prescriptions  Medication Sig Dispense Refill   . Calcium Carbonate-Vitamin D (CALCIUM + D PO) Take by mouth daily.        . citalopram (CELEXA) 10 MG tablet       . Cyanocobalamin (VITAMIN B 12 PO) Take 1 tablet by mouth.      . enzalutamide (XTANDI) 40 MG capsule Take 4 capsules (160 mg total) by mouth daily.  120 capsule  0  . levothyroxine (SYNTHROID, LEVOTHROID) 75 MCG tablet Take 75 mcg by mouth daily.      . naproxen sodium (ANAPROX) 220 MG tablet Take 220 mg by mouth every 8 (eight) hours as needed (pain).       No current facility-administered medications for this visit.     Allergies: No Known Allergies  Past Medical History, Surgical history, Social history, and Family History were reviewed and updated.  Review of Systems:  Remaining ROS negative. Physical Exam: Blood pressure 152/74, pulse 52, temperature 98.2 F (36.8 C), temperature source Oral, resp. rate 19, height 5\' 9"  (1.753 m), weight 0 lb (0 kg).  ECOG: 2  General appearance: alert awake and not in any distress. Head: Normocephalic, without obvious abnormality, atraumatic Neck: no adenopathy Lymph nodes: Cervical, supraclavicular, and axillary nodes normal. Heart:regular rate and rhythm, S1, S2 normal, no murmur, click, rub or gallop Lung:chest clear, no wheezing, rales, normal symmetric air entry Abdomen: soft, non-tender, without masses or organomegaly EXT:no erythema, induration, or nodules   Lab Results: Lab Results  Component Value Date   WBC 6.3 02/15/2014   HGB 12.6* 02/15/2014   HCT 38.2* 02/15/2014   MCV 92.5 02/15/2014   PLT 286 02/15/2014  Chemistry      Component Value Date/Time   NA 140 02/15/2014 1409   NA 141 01/07/2014 2349   K 4.0 02/15/2014 1409   K 4.9 01/07/2014 2349   CL 104 01/07/2014 2349   CL 107 11/18/2012 1346   CO2 28 02/15/2014 1409   CO2 21 01/07/2014 2349   BUN 12.0 02/15/2014 1409   BUN 11 01/07/2014 2349   CREATININE 0.7 02/15/2014 1409   CREATININE 0.73 01/07/2014 2349      Component Value Date/Time   CALCIUM 8.9  02/15/2014 1409   CALCIUM 8.9 01/07/2014 2349   ALKPHOS 53 02/15/2014 1409   ALKPHOS 53 12/18/2009 1420   AST 14 02/15/2014 1409   AST 17 12/18/2009 1420   ALT 8 02/15/2014 1409   ALT 12 12/18/2009 1420   BILITOT 0.41 02/15/2014 1409   BILITOT <0.1* 12/18/2009 1420      Results for Stephen English (MRN [id number]) as of 12/14/2013 15:30  Ref. Range 08/25/2013 12:49 10/06/2013 15:25 11/10/2013 14:23  PSA Latest Range: <=4.00 ng/mL 12.73 (H) 8.57 (H) 7.43 (H)    Impression and Plan:  81 year old gentleman with the following issues:  1. Castration resistant prostate cancer with metastatic disease to the bone. He is currently on Xtandi and tolerating well. His PSA dropped dramatically from 114.9 now down to 6.09. No major side effects noted from this medication at this time. Despite his poor performance status, he has not had any major changes in his quality of life. The plan is to continue Xtandi without any dose reductions or delay.  2. Bone health: He would like to have Xgeva done here with his regular visits. He will receive his injection today as scheduled. He is to continue his calcium supplements. I continued to educate him about complications related to this medication such as osteonecrosis of the jaw.  3. Androgen depravation he is status post orchiectomy.  4. Anxiety/vivid dreams: None reported this visit. Unlikely due to Gillermina Phy has been less of an issue at this time.  5. Followup: 3 months with Xgeva injection.   Patient reviewed with Dr. Alen Blew.  Awilda Metro E 8/27/20155:02 PM

## 2014-02-15 NOTE — Telephone Encounter (Signed)
gv and printed appt sched and avs for pt for NOV °

## 2014-02-16 LAB — PSA: PSA: 6.09 ng/mL — ABNORMAL HIGH (ref <=4.00)

## 2014-02-18 NOTE — Patient Instructions (Signed)
Continue Xtandi at the current dose Follow up in 3 months with your next Xgeva injection

## 2014-02-23 ENCOUNTER — Other Ambulatory Visit: Payer: Self-pay | Admitting: *Deleted

## 2014-02-23 DIAGNOSIS — C7951 Secondary malignant neoplasm of bone: Secondary | ICD-10-CM

## 2014-02-23 DIAGNOSIS — C61 Malignant neoplasm of prostate: Secondary | ICD-10-CM

## 2014-02-23 NOTE — Telephone Encounter (Signed)
THIS REFILL REQUEST FOR XTANDI WAS PLACED IN DR.SHADAD'S ACTIVE WORK FOLDER. 

## 2014-02-27 MED ORDER — ENZALUTAMIDE 40 MG PO CAPS: 160.0000 mg | ORAL_CAPSULE | Freq: Every day | ORAL | Status: DC

## 2014-02-27 NOTE — Telephone Encounter (Signed)
RECEIVED A FAX FROM [employer] CONCERNING A CONFIRMATION OF FACSIMILE RECEIPT. 

## 2014-02-27 NOTE — Addendum Note (Signed)
Addended by: Wyonia Hough on: 02/27/2014 11:43 AM   Modules accepted: Orders

## 2014-03-02 NOTE — Telephone Encounter (Signed)
RECEIVED A FAX FROM [employer] CONCERNING A CONFIRMATION OF PRESCRIPTION SHIPMENT FOR XTANDI ON 03/01/14.

## 2014-03-27 ENCOUNTER — Other Ambulatory Visit: Payer: Self-pay | Admitting: *Deleted

## 2014-03-27 DIAGNOSIS — C61 Malignant neoplasm of prostate: Secondary | ICD-10-CM

## 2014-03-27 DIAGNOSIS — C7951 Secondary malignant neoplasm of bone: Secondary | ICD-10-CM

## 2014-03-27 MED ORDER — ENZALUTAMIDE 40 MG PO CAPS: 160.0000 mg | ORAL_CAPSULE | Freq: Every day | ORAL | Status: DC

## 2014-03-27 NOTE — Telephone Encounter (Signed)
THIS REFILL REQUEST FOR XTANDI WAS PLACED IN DR.SHADAD'S ACTIVE WORK FOLDER. 

## 2014-03-28 ENCOUNTER — Telehealth: Payer: Self-pay | Admitting: *Deleted

## 2014-03-28 ENCOUNTER — Other Ambulatory Visit: Payer: Self-pay | Admitting: *Deleted

## 2014-03-28 DIAGNOSIS — C7951 Secondary malignant neoplasm of bone: Secondary | ICD-10-CM

## 2014-03-28 DIAGNOSIS — C61 Malignant neoplasm of prostate: Secondary | ICD-10-CM

## 2014-03-28 MED ORDER — ENZALUTAMIDE 40 MG PO CAPS: 160.0000 mg | ORAL_CAPSULE | Freq: Every day | ORAL | Status: DC

## 2014-03-28 NOTE — Telephone Encounter (Signed)
[facility] faxed Xtandi refill request.  Request to provider's desk/in-basket for review.  Will also notify [facility] to determine service.

## 2014-03-29 ENCOUNTER — Encounter: Payer: Self-pay | Admitting: *Deleted

## 2014-03-29 ENCOUNTER — Telehealth: Payer: Self-pay | Admitting: Medical Oncology

## 2014-03-29 NOTE — Progress Notes (Signed)
RECEIVED A FAX FROM [facility] CONCERNING A PATIENT UPDATE: ADVERSE EVENT MONITORING. THIS REPORT WAS GIVEN TO DR.SHADAD'S NURSE, MIRA LEONETTI,RN.

## 2014-03-29 NOTE — Telephone Encounter (Signed)
F/u call to patient regarding fax received from [employer] and patient's adverse effects that he has been experiencing. [facility] report states "patient states he has noticed that he gets nervous from time to time and has had some hallucinations, he states he gets so nervous sometimes that his hands tremble." Asked patient to describe the hallucinations, he states at times he sees "two of things." Patient reports to have "good days and bad days" since starting on Xtandi, has soreness to joints that resolve once he starts to "move around" and does confirm that he's been taking Xtandi daily. Informed patient will review with MD these concerns and adverse effects and call patient back tomorrow when MD is back in office.    LOV 08/27 F/U 05/11/14 lab/MD/inj  Mssg placed in MD inbox folder along with Adverse Event Monitoring report from [employer] for review.

## 2014-03-30 NOTE — Telephone Encounter (Signed)
RECEIVED A FAX FROM [employer] CONCERNING A CONFIRMATION OF PRESCRRIPTION SHIPMENT FOR XTANDI ON 03/29/14.

## 2014-04-09 ENCOUNTER — Telehealth: Payer: Self-pay | Admitting: *Deleted

## 2014-04-09 NOTE — Telephone Encounter (Signed)
Fax received from "[employer] clinical on behalf of product safety and pharmacovigilance".   Request to provider in-basket for review.  Next scheduled F/U 05-11-2014

## 2014-04-26 ENCOUNTER — Encounter: Payer: Self-pay | Admitting: *Deleted

## 2014-04-26 NOTE — Progress Notes (Signed)
RECEIVED A FAX FROM [facility] CONCERNING A CONFIRMATION OF PRESCRIPTION SHIPMENT FOR XTANDI ON 04/25/14.

## 2014-05-11 ENCOUNTER — Ambulatory Visit (HOSPITAL_BASED_OUTPATIENT_CLINIC_OR_DEPARTMENT_OTHER): Payer: Medicare Other

## 2014-05-11 ENCOUNTER — Ambulatory Visit (HOSPITAL_BASED_OUTPATIENT_CLINIC_OR_DEPARTMENT_OTHER): Payer: Medicare Other | Admitting: Oncology

## 2014-05-11 ENCOUNTER — Other Ambulatory Visit (HOSPITAL_BASED_OUTPATIENT_CLINIC_OR_DEPARTMENT_OTHER): Payer: Medicare Other

## 2014-05-11 DIAGNOSIS — C7951 Secondary malignant neoplasm of bone: Secondary | ICD-10-CM

## 2014-05-11 DIAGNOSIS — C61 Malignant neoplasm of prostate: Secondary | ICD-10-CM

## 2014-05-11 LAB — CBC WITH DIFFERENTIAL/PLATELET
BASO%: 0.7 % (ref 0.0–2.0)
BASOS ABS: 0.1 10*3/uL (ref 0.0–0.1)
EOS%: 4.9 % (ref 0.0–7.0)
Eosinophils Absolute: 0.4 10*3/uL (ref 0.0–0.5)
HCT: 40.2 % (ref 38.4–49.9)
HEMOGLOBIN: 13.3 g/dL (ref 13.0–17.1)
LYMPH#: 1.8 10*3/uL (ref 0.9–3.3)
LYMPH%: 24.1 % (ref 14.0–49.0)
MCH: 30.6 pg (ref 27.2–33.4)
MCHC: 33.1 g/dL (ref 32.0–36.0)
MCV: 92.6 fL (ref 79.3–98.0)
MONO#: 0.5 10*3/uL (ref 0.1–0.9)
MONO%: 7.2 % (ref 0.0–14.0)
NEUT#: 4.7 10*3/uL (ref 1.5–6.5)
NEUT%: 63.1 % (ref 39.0–75.0)
PLATELETS: 316 10*3/uL (ref 140–400)
RBC: 4.34 10*6/uL (ref 4.20–5.82)
RDW: 14.2 % (ref 11.0–14.6)
WBC: 7.5 10*3/uL (ref 4.0–10.3)

## 2014-05-11 LAB — COMPREHENSIVE METABOLIC PANEL (CC13)
ALT: 8 U/L (ref 0–55)
ANION GAP: 8 meq/L (ref 3–11)
AST: 12 U/L (ref 5–34)
Albumin: 3.2 g/dL — ABNORMAL LOW (ref 3.5–5.0)
Alkaline Phosphatase: 60 U/L (ref 40–150)
BUN: 12 mg/dL (ref 7.0–26.0)
CALCIUM: 9.1 mg/dL (ref 8.4–10.4)
CHLORIDE: 102 meq/L (ref 98–109)
CO2: 28 meq/L (ref 22–29)
Creatinine: 0.9 mg/dL (ref 0.7–1.3)
Glucose: 110 mg/dl (ref 70–140)
Potassium: 4.5 mEq/L (ref 3.5–5.1)
Sodium: 138 mEq/L (ref 136–145)
Total Bilirubin: 0.35 mg/dL (ref 0.20–1.20)
Total Protein: 7.1 g/dL (ref 6.4–8.3)

## 2014-05-11 MED ORDER — DENOSUMAB 120 MG/1.7ML ~~LOC~~ SOLN
120.0000 mg | Freq: Once | SUBCUTANEOUS | Status: AC
  Administered 2014-05-11: 120 mg via SUBCUTANEOUS
  Filled 2014-05-11: qty 1.7

## 2014-05-11 NOTE — Progress Notes (Signed)
Hematology and Oncology Follow Up Visit  Stephen English [id number] July 29, 1924 80 y.o. 05/11/2014 12:13 PM   Principle Diagnosis: 80 year old gentleman with prostate cancer diagnosed in 2011 presented with obstructive uropathy and advanced disease to the bone. His PSA was 335 and his Gleason score was 4+5 equals 9.   Prior Therapy:  He is status post bilateral orchiectomy done in June of 2011 with an excellent PSA response. His PSA went to 51 in September of 2012 and Casodex was added with the PSA dropping down to 10.9. Most recently his PSA went up to 18 in May of 2014 and a repeat bone scan in 11/11/2012 showed essentially unchanged bony metastasis.  Current therapy:  Started Xtandi 160 mg daily in January 2015. He is on Xgeva given at [facility]. This will be given at the [facility] in the future  Interim History:  Stephen English presents today for a followup visit with his wife. Since his last visit, he is doing about the same. He is tolerating Xtandi without any new issues. He complains of occasional hallucination and confusion at times. He thinks some of these are attributed to xtandi but he is not sure. He has not reported any fatigue or tiredness.  He is not reporting any change in his urination or his performance status. His appetite is excellent and performance status although limited but unchanged. He has not reported any hospitalization or illnesses. Has not reported any pathological fractures or recurrent infections. Denies chest pain, SOB, DOE, abdominal pain, nausea, and vomiting. He has not reported any syncope or falls. Has not reported any chest pain or palpitation. Has not reported any cough or hemoptysis or hematemesis. Remainder of the review of systems unremarkable.  Medications: I have reviewed the patient's current medications.  Current Outpatient Prescriptions  Medication Sig Dispense Refill  . Calcium Carbonate-Vitamin D (CALCIUM + D PO) Take by  mouth daily.      . citalopram (CELEXA) 10 MG tablet     . Cyanocobalamin (VITAMIN B 12 PO) Take 1 tablet by mouth.    . enzalutamide (XTANDI) 40 MG capsule Take 4 capsules (160 mg total) by mouth daily. 120 capsule 0  . levothyroxine (SYNTHROID, LEVOTHROID) 100 MCG tablet Take 100 mcg by mouth daily.  0  . naproxen sodium (ANAPROX) 220 MG tablet Take 220 mg by mouth every 8 (eight) hours as needed (pain).     Current Facility-Administered Medications  Medication Dose Route Frequency Provider Last Rate Last Dose  . denosumab (XGEVA) injection 120 mg  120 mg Subcutaneous Once Wyatt Portela, MD         Allergies: No Known Allergies  Past Medical History, Surgical history, Social history, and Family History were reviewed and updated.   Physical Exam: Blood pressure 131/55, pulse 52, temperature 98 F (36.7 C), temperature source Oral, resp. rate 18, weight 0 lb (0 kg), SpO2 96 %.  ECOG: 2  General appearance: alert awake and not in any distress. Head: Normocephalic, without obvious abnormality, atraumatic Neck: no adenopathy Lymph nodes: Cervical, supraclavicular, and axillary nodes normal. Heart:regular rate and rhythm, S1, S2 normal, no murmur, click, rub or gallop Lung:chest clear, no wheezing, rales, normal symmetric air entry Abdomen: soft, non-tender, without masses or organomegaly EXT:no erythema, induration, or nodules   Lab Results: Lab Results  Component Value Date   WBC 7.5 05/11/2014   HGB 13.3 05/11/2014   HCT 40.2 05/11/2014   MCV 92.6 05/11/2014   PLT 316 05/11/2014  Chemistry      Component Value Date/Time   NA 140 02/15/2014 1409   NA 141 01/07/2014 2349   K 4.0 02/15/2014 1409   K 4.9 01/07/2014 2349   CL 104 01/07/2014 2349   CL 107 11/18/2012 1346   CO2 28 02/15/2014 1409   CO2 21 01/07/2014 2349   BUN 12.0 02/15/2014 1409   BUN 11 01/07/2014 2349   CREATININE 0.7 02/15/2014 1409   CREATININE 0.73 01/07/2014 2349      Component Value  Date/Time   CALCIUM 8.9 02/15/2014 1409   CALCIUM 8.9 01/07/2014 2349   ALKPHOS 53 02/15/2014 1409   ALKPHOS 53 12/18/2009 1420   AST 14 02/15/2014 1409   AST 17 12/18/2009 1420   ALT 8 02/15/2014 1409   ALT 12 12/18/2009 1420   BILITOT 0.41 02/15/2014 1409   BILITOT <0.1* 12/18/2009 1420       Results for Stephen English, Stephen English (MRN [id number]) as of 05/11/2014 12:14  Ref. Range 11/18/2012 13:46 02/10/2013 13:16 06/20/2013 14:51 08/25/2013 12:49 10/06/2013 15:25 11/10/2013 14:23 12/14/2013 15:03 02/15/2014 14:09  PSA Latest Range: <=4.00 ng/mL 19.64 (H) 55.35 (H) 114.90 (H) 12.73 (H) 8.57 (H) 7.43 (H) 7.04 (H) 6.09 (H)    Impression and Plan:  80 year old gentleman with the following issues:  1. Castration resistant prostate cancer with metastatic disease to the bone. He is currently on Xtandi and tolerating well. His PSA dropped dramatically from 114.9 now down to 6.09. He is complaining about occasional hallucination and confusion that could be related to this medication. I advised him to reduce the dose to 120 mg which is 3 tablets daily. We will assess him in 6 weeks to see if this maneuver made any difference.  2. Bone health: He would like to have Xgeva done here with his regular visits. He will receive his injection today and will be repeated every 3 months.. He is to continue his calcium supplements. I continued to educate him about complications related to this medication such as osteonecrosis of the jaw.  3. Androgen depravation he is status post orchiectomy.  4. Anxiety/vivid dreams: None reported this visit. Unlikely due to Gillermina Phy has been less of an issue at this time.  5. Followup: In 6 weeks to assess the dose reduction and will receive Xgeva as mentioned every 3 months.    LKJZPH,XTAVW 11/20/201512:13 PM

## 2014-05-11 NOTE — Patient Instructions (Signed)
Denosumab injection What is this medicine? DENOSUMAB (den oh sue mab) slows bone breakdown. Prolia is used to treat osteoporosis in women after menopause and in men. Xgeva is used to prevent bone fractures and other bone problems caused by cancer bone metastases. Xgeva is also used to treat giant cell tumor of the bone. This medicine may be used for other purposes; ask your health care provider or pharmacist if you have questions. COMMON BRAND NAME(S): Prolia, XGEVA What should I tell my health care provider before I take this medicine? They need to know if you have any of these conditions: -dental disease -eczema -infection or history of infections -kidney disease or on dialysis -low blood calcium or vitamin D -malabsorption syndrome -scheduled to have surgery or tooth extraction -taking medicine that contains denosumab -thyroid or parathyroid disease -an unusual reaction to denosumab, other medicines, foods, dyes, or preservatives -pregnant or trying to get pregnant -breast-feeding How should I use this medicine? This medicine is for injection under the skin. It is given by a health care professional in a hospital or clinic setting. If you are getting Prolia, a special MedGuide will be given to you by the pharmacist with each prescription and refill. Be sure to read this information carefully each time. For Prolia, talk to your pediatrician regarding the use of this medicine in children. Special care may be needed. For Xgeva, talk to your pediatrician regarding the use of this medicine in children. While this drug may be prescribed for children as young as 13 years for selected conditions, precautions do apply. Overdosage: If you think you've taken too much of this medicine contact a poison control center or emergency room at once. Overdosage: If you think you have taken too much of this medicine contact a poison control center or emergency room at once. NOTE: This medicine is only for  you. Do not share this medicine with others. What if I miss a dose? It is important not to miss your dose. Call your doctor or health care professional if you are unable to keep an appointment. What may interact with this medicine? Do not take this medicine with any of the following medications: -other medicines containing denosumab This medicine may also interact with the following medications: -medicines that suppress the immune system -medicines that treat cancer -steroid medicines like prednisone or cortisone This list may not describe all possible interactions. Give your health care provider a list of all the medicines, herbs, non-prescription drugs, or dietary supplements you use. Also tell them if you smoke, drink alcohol, or use illegal drugs. Some items may interact with your medicine. What should I watch for while using this medicine? Visit your doctor or health care professional for regular checks on your progress. Your doctor or health care professional may order blood tests and other tests to see how you are doing. Call your doctor or health care professional if you get a cold or other infection while receiving this medicine. Do not treat yourself. This medicine may decrease your body's ability to fight infection. You should make sure you get enough calcium and vitamin D while you are taking this medicine, unless your doctor tells you not to. Discuss the foods you eat and the vitamins you take with your health care professional. See your dentist regularly. Brush and floss your teeth as directed. Before you have any dental work done, tell your dentist you are receiving this medicine. Do not become pregnant while taking this medicine or for 5 months after stopping   it. Women should inform their doctor if they wish to become pregnant or think they might be pregnant. There is a potential for serious side effects to an unborn child. Talk to your health care professional or pharmacist for more  information. What side effects may I notice from receiving this medicine? Side effects that you should report to your doctor or health care professional as soon as possible: -allergic reactions like skin rash, itching or hives, swelling of the face, lips, or tongue -breathing problems -chest pain -fast, irregular heartbeat -feeling faint or lightheaded, falls -fever, chills, or any other sign of infection -muscle spasms, tightening, or twitches -numbness or tingling -skin blisters or bumps, or is dry, peels, or red -slow healing or unexplained pain in the mouth or jaw -unusual bleeding or bruising Side effects that usually do not require medical attention (Report these to your doctor or health care professional if they continue or are bothersome.): -muscle pain -stomach upset, gas This list may not describe all possible side effects. Call your doctor for medical advice about side effects. You may report side effects to FDA at 1-800-FDA-1088. Where should I keep my medicine? This medicine is only given in a clinic, doctor's office, or other health care setting and will not be stored at home. NOTE: This sheet is a summary. It may not cover all possible information. If you have questions about this medicine, talk to your doctor, pharmacist, or health care provider.  2015, [employer]/Gold Standard. (2011-12-07 12:37:47)  

## 2014-05-12 LAB — PSA: PSA: 5.94 ng/mL — ABNORMAL HIGH (ref <=4.00)

## 2014-05-15 ENCOUNTER — Telehealth: Payer: Self-pay | Admitting: Medical Oncology

## 2014-05-15 NOTE — Telephone Encounter (Signed)
Patient called requesting lab results from 11/20. Results given. No further questions at this time.

## 2014-05-25 ENCOUNTER — Other Ambulatory Visit: Payer: Self-pay | Admitting: *Deleted

## 2014-05-25 DIAGNOSIS — C7951 Secondary malignant neoplasm of bone: Secondary | ICD-10-CM

## 2014-05-25 DIAGNOSIS — C61 Malignant neoplasm of prostate: Secondary | ICD-10-CM

## 2014-05-25 MED ORDER — ENZALUTAMIDE 40 MG PO CAPS: 120.0000 mg | ORAL_CAPSULE | Freq: Every day | ORAL | Status: DC

## 2014-05-25 NOTE — Telephone Encounter (Signed)
RECEIVED A FAX FROM [facility] CONCERNING A CONFIRMATION OF FACSIMILE RECEIPT FOR PT. REFERRAL. 

## 2014-05-25 NOTE — Addendum Note (Signed)
Addended by: Wyonia Hough on: 05/25/2014 02:51 PM   Modules accepted: Orders

## 2014-05-25 NOTE — Telephone Encounter (Signed)
THIS REFILL REQUEST FOR XTANDI WAS PLACED IN DR.SHADAD'S ACTIVE WORK FOLDER. 

## 2014-05-29 NOTE — Telephone Encounter (Signed)
RECEIVED A FAX FROM [facility]ERNING A CONFIRMATION OF PRESCRIPTION SHIPMENT FOR XTANDI ON 05/28/14.

## 2014-06-14 ENCOUNTER — Other Ambulatory Visit: Payer: Self-pay | Admitting: Medical Oncology

## 2014-06-14 ENCOUNTER — Other Ambulatory Visit: Payer: Self-pay | Admitting: *Deleted

## 2014-06-14 DIAGNOSIS — C61 Malignant neoplasm of prostate: Secondary | ICD-10-CM

## 2014-06-14 DIAGNOSIS — C7951 Secondary malignant neoplasm of bone: Secondary | ICD-10-CM

## 2014-06-14 NOTE — Progress Notes (Signed)
Patient seen in 05/11/2014, needs f/u appt with MD, per MD office notes.  Onc tx sent.

## 2014-06-14 NOTE — Telephone Encounter (Signed)
THIS REFILL REQUEST FOR XTANDI WAS PLACED IN DR.SHADAD'S ACTIVE WORK FOLDER.

## 2014-06-19 MED ORDER — ENZALUTAMIDE 40 MG PO CAPS: 120.0000 mg | ORAL_CAPSULE | Freq: Every day | ORAL | Status: DC

## 2014-06-19 NOTE — Addendum Note (Signed)
Addended by: Wyonia Hough on: 06/19/2014 10:20 AM   Modules accepted: Orders

## 2014-07-20 ENCOUNTER — Other Ambulatory Visit: Payer: Self-pay | Admitting: *Deleted

## 2014-07-20 DIAGNOSIS — C61 Malignant neoplasm of prostate: Secondary | ICD-10-CM

## 2014-07-20 DIAGNOSIS — C7951 Secondary malignant neoplasm of bone: Secondary | ICD-10-CM

## 2014-07-20 MED ORDER — ENZALUTAMIDE 40 MG PO CAPS: 120.0000 mg | ORAL_CAPSULE | Freq: Every day | ORAL | Status: DC

## 2014-07-20 NOTE — Telephone Encounter (Signed)
THIS REFILL REQUEST FOR XTANDI WAS PLACE IN DR.SHADAD'S ACTIVE WORK FOLDER.

## 2014-07-23 ENCOUNTER — Other Ambulatory Visit: Payer: Self-pay

## 2014-07-23 NOTE — Telephone Encounter (Signed)
RECEIVED A FAX FROM [facility] CONCERNING A CONFIRMATION OF FACSIMILE RECEIPT FOR PT. REFERRAL. 

## 2014-08-07 ENCOUNTER — Telehealth: Payer: Self-pay | Admitting: Oncology

## 2014-08-07 NOTE — Telephone Encounter (Signed)
Pt called wanted to schedule MD visit and injection, no POF at previous visit, sent msg to MD/desk nurse to request MD/injection D/T so that I can call pt with it..... KJ

## 2014-08-08 ENCOUNTER — Telehealth: Payer: Self-pay | Admitting: Oncology

## 2014-08-08 NOTE — Telephone Encounter (Signed)
S/w pt confirming labs/ov/inj per MD staff msg for 3 mth f/u.... Stephen English

## 2014-08-21 ENCOUNTER — Other Ambulatory Visit: Payer: Self-pay | Admitting: *Deleted

## 2014-08-21 DIAGNOSIS — C7951 Secondary malignant neoplasm of bone: Secondary | ICD-10-CM

## 2014-08-21 DIAGNOSIS — C61 Malignant neoplasm of prostate: Secondary | ICD-10-CM

## 2014-08-21 MED ORDER — ENZALUTAMIDE 40 MG PO CAPS: 120.0000 mg | ORAL_CAPSULE | Freq: Every day | ORAL | Status: DC

## 2014-10-23 ENCOUNTER — Other Ambulatory Visit: Payer: Self-pay | Admitting: *Deleted

## 2014-10-23 DIAGNOSIS — C61 Malignant neoplasm of prostate: Secondary | ICD-10-CM

## 2014-10-23 DIAGNOSIS — C7951 Secondary malignant neoplasm of bone: Secondary | ICD-10-CM

## 2014-10-23 MED ORDER — ENZALUTAMIDE 40 MG PO CAPS: 120.0000 mg | ORAL_CAPSULE | Freq: Every day | ORAL | Status: DC

## 2014-11-06 ENCOUNTER — Ambulatory Visit (HOSPITAL_BASED_OUTPATIENT_CLINIC_OR_DEPARTMENT_OTHER): Payer: Medicare Other | Admitting: Oncology

## 2014-11-06 ENCOUNTER — Telehealth: Payer: Self-pay | Admitting: Oncology

## 2014-11-06 ENCOUNTER — Other Ambulatory Visit (HOSPITAL_BASED_OUTPATIENT_CLINIC_OR_DEPARTMENT_OTHER): Payer: Medicare Other

## 2014-11-06 ENCOUNTER — Ambulatory Visit (HOSPITAL_BASED_OUTPATIENT_CLINIC_OR_DEPARTMENT_OTHER): Payer: Medicare Other

## 2014-11-06 VITALS — BP 95/72 | HR 133 | Temp 97.7°F | Resp 19 | Ht 69.0 in

## 2014-11-06 DIAGNOSIS — C61 Malignant neoplasm of prostate: Secondary | ICD-10-CM

## 2014-11-06 DIAGNOSIS — F411 Generalized anxiety disorder: Secondary | ICD-10-CM | POA: Diagnosis not present

## 2014-11-06 DIAGNOSIS — C7951 Secondary malignant neoplasm of bone: Secondary | ICD-10-CM

## 2014-11-06 DIAGNOSIS — E291 Testicular hypofunction: Secondary | ICD-10-CM

## 2014-11-06 LAB — CBC WITH DIFFERENTIAL/PLATELET
BASO%: 1.3 % (ref 0.0–2.0)
BASOS ABS: 0.1 10*3/uL (ref 0.0–0.1)
EOS%: 5 % (ref 0.0–7.0)
Eosinophils Absolute: 0.4 10*3/uL (ref 0.0–0.5)
HEMATOCRIT: 40.6 % (ref 38.4–49.9)
HEMOGLOBIN: 13.3 g/dL (ref 13.0–17.1)
LYMPH#: 1.7 10*3/uL (ref 0.9–3.3)
LYMPH%: 21.8 % (ref 14.0–49.0)
MCH: 30.4 pg (ref 27.2–33.4)
MCHC: 32.7 g/dL (ref 32.0–36.0)
MCV: 92.7 fL (ref 79.3–98.0)
MONO#: 0.5 10*3/uL (ref 0.1–0.9)
MONO%: 6.9 % (ref 0.0–14.0)
NEUT#: 5 10*3/uL (ref 1.5–6.5)
NEUT%: 65 % (ref 39.0–75.0)
PLATELETS: 256 10*3/uL (ref 140–400)
RBC: 4.38 10*6/uL (ref 4.20–5.82)
RDW: 14.9 % — ABNORMAL HIGH (ref 11.0–14.6)
WBC: 7.6 10*3/uL (ref 4.0–10.3)

## 2014-11-06 LAB — COMPREHENSIVE METABOLIC PANEL (CC13)
ALBUMIN: 3.1 g/dL — AB (ref 3.5–5.0)
ALT: 6 U/L (ref 0–55)
ANION GAP: 11 meq/L (ref 3–11)
AST: 12 U/L (ref 5–34)
Alkaline Phosphatase: 57 U/L (ref 40–150)
BUN: 11.9 mg/dL (ref 7.0–26.0)
CO2: 29 meq/L (ref 22–29)
Calcium: 9.2 mg/dL (ref 8.4–10.4)
Chloride: 101 mEq/L (ref 98–109)
Creatinine: 0.8 mg/dL (ref 0.7–1.3)
EGFR: 82 mL/min/{1.73_m2} — ABNORMAL LOW (ref >90)
GLUCOSE: 107 mg/dL (ref 70–140)
Potassium: 4.3 mEq/L (ref 3.5–5.1)
SODIUM: 142 meq/L (ref 136–145)
Total Bilirubin: 0.33 mg/dL (ref 0.20–1.20)
Total Protein: 6.9 g/dL (ref 6.4–8.3)

## 2014-11-06 MED ORDER — DENOSUMAB 120 MG/1.7ML ~~LOC~~ SOLN
120.0000 mg | Freq: Once | SUBCUTANEOUS | Status: AC
  Administered 2014-11-06: 120 mg via SUBCUTANEOUS
  Filled 2014-11-06: qty 1.7

## 2014-11-06 NOTE — Telephone Encounter (Signed)
per pof to sch pt appt-gave pt copy of sch °

## 2014-11-06 NOTE — Progress Notes (Signed)
Hematology and Oncology Follow Up Visit  Stephen English [id number] 1925-06-22 81 y.o. 11/06/2014 1:13 PM   Principle Diagnosis: 81 year old gentleman with prostate cancer diagnosed in 2011 presented with obstructive uropathy and advanced disease to the bone. His PSA was 335 and his Gleason score was 4+5 equals 9.   Prior Therapy:  He is status post bilateral orchiectomy done in June of 2011 with an excellent PSA response. His PSA went to 92 in September of 2012 and Casodex was added with the PSA dropping down to 10.9. Most recently his PSA went up to 18 in May of 2014 and a repeat bone scan in 11/11/2012 showed essentially unchanged bony metastasis.  Current therapy:  Started Xtandi 160 mg daily in January 2015. This was reduced to 120 mg in November 2015 for better tolerance [facility]  Interim History:  Stephen English presents today for a followup visit with his wife. Since his last visit, he continues to be relatively stable. He is tolerating Xtandi at the reduced dose without any new issues. He has not reported any fatigue or tiredness.  He is not reporting any change in his urination or his performance status. His appetite is excellent and performance status although limited but unchanged. He continues to be wheelchair bound for the most part. He has not reported any bone pain or pathological fractures. He has not reported any hospitalization or illnesses. Has not reported any pathological fractures or recurrent infections. Denies chest pain, SOB, DOE, abdominal pain, nausea, and vomiting. He has not reported any syncope or falls. Has not reported any chest pain or palpitation. Has not reported any cough or hemoptysis or hematemesis. Remainder of the review of systems unremarkable.  Medications: I have reviewed the patient's current medications.  Current Outpatient Prescriptions  Medication Sig Dispense Refill  . Calcium  Carbonate-Vitamin D (CALCIUM + D PO) Take by mouth daily.      . citalopram (CELEXA) 10 MG tablet     . Cyanocobalamin (VITAMIN B 12 PO) Take 1 tablet by mouth.    . enzalutamide (XTANDI) 40 MG capsule Take 3 capsules (120 mg total) by mouth daily. 90 capsule 0  . levothyroxine (SYNTHROID, LEVOTHROID) 100 MCG tablet Take 100 mcg by mouth daily.  0  . naproxen sodium (ANAPROX) 220 MG tablet Take 220 mg by mouth every 8 (eight) hours as needed (pain).     No current facility-administered medications for this visit.     Allergies: No Known Allergies  Past Medical History, Surgical history, Social history, and Family History were reviewed and updated.   Physical Exam: Blood pressure 95/72, pulse 90, temperature 97.7 F (36.5 C), temperature source Oral, resp. rate 19, height 5\' 9"  (1.753 m), SpO2 93 %.  ECOG: 2  General appearance: alert awake and not in any distress. Head: Normocephalic, without obvious abnormality, atraumatic Neck: no adenopathy Lymph nodes: Cervical, supraclavicular, and axillary nodes normal. Heart:regular rate and rhythm, S1, S2 normal, no murmur, click, rub or gallop. I cannot appreciate any arrhythmia. His heart rate was around 90 and regular. Lung:chest clear, no wheezing, rales, normal symmetric air entry Abdomen: soft, non-tender, without masses or organomegaly EXT:no erythema, induration, or nodules   Lab Results: Lab Results  Component Value Date   WBC 7.6 11/06/2014   HGB 13.3 11/06/2014   HCT 40.6 11/06/2014   MCV 92.7 11/06/2014   PLT 256 11/06/2014     Chemistry  Component Value Date/Time   NA 138 05/11/2014 1128   NA 141 01/07/2014 2349   K 4.5 05/11/2014 1128   K 4.9 01/07/2014 2349   CL 104 01/07/2014 2349   CL 107 11/18/2012 1346   CO2 28 05/11/2014 1128   CO2 21 01/07/2014 2349   BUN 12.0 05/11/2014 1128   BUN 11 01/07/2014 2349   CREATININE 0.9 05/11/2014 1128   CREATININE 0.73 01/07/2014 2349      Component Value  Date/Time   CALCIUM 9.1 05/11/2014 1128   CALCIUM 8.9 01/07/2014 2349   ALKPHOS 60 05/11/2014 1128   ALKPHOS 53 12/18/2009 1420   AST 12 05/11/2014 1128   AST 17 12/18/2009 1420   ALT 8 05/11/2014 1128   ALT 12 12/18/2009 1420   BILITOT 0.35 05/11/2014 1128   BILITOT <0.1* 12/18/2009 1420      Results for Stephen English, Stephen English (MRN [id number]) as of 11/06/2014 12:59  Ref. Range 11/10/2013 14:23 12/14/2013 15:03 02/15/2014 14:09 05/11/2014 11:28  PSA Latest Ref Range: <=4.00 ng/mL 7.43 (H) 7.04 (H) 6.09 (H) 5.94 (H)      Impression and Plan:  81 year old gentleman with the following issues:  1. Castration resistant prostate cancer with metastatic disease to the bone. He is currently on Xtandi and tolerating well. His PSA dropped dramatically from 114.9 now down to 5.94. He is tolerating the 120 mg dose and have no problems continuing. We will continue the same dose and schedule. We will use a different salvage regimen if he progresses.  2. Bone health: He would like to have Xgeva done here with his regular visits. He will receive his injection today and will be repeated with every visit. He will likely get it every 6 months at this time.  3. Androgen depravation he is status post orchiectomy.  4. Anxiety/vivid dreams: None reported this visit. Unlikely due to Stephen English has been less of an issue at this time.  5. Followup: In 6 months or sooner if needed to.    Nahal Wanless 5/17/20161:13 PM

## 2014-11-07 ENCOUNTER — Telehealth: Payer: Self-pay | Admitting: *Deleted

## 2014-11-07 LAB — PSA: PSA: 5.05 ng/mL — ABNORMAL HIGH (ref <=4.00)

## 2014-11-07 NOTE — Telephone Encounter (Signed)
Per Dr. Alen Blew, I informed patient that his PSA was  5.0. Patient verbalized understanding.

## 2014-11-07 NOTE — Telephone Encounter (Signed)
-----   Message from Wyatt Portela, MD sent at 11/07/2014  9:44 AM EDT ----- Please call his PSA

## 2014-11-21 ENCOUNTER — Other Ambulatory Visit: Payer: Self-pay | Admitting: *Deleted

## 2014-11-21 DIAGNOSIS — C7951 Secondary malignant neoplasm of bone: Secondary | ICD-10-CM

## 2014-11-21 DIAGNOSIS — C61 Malignant neoplasm of prostate: Secondary | ICD-10-CM

## 2014-11-21 MED ORDER — ENZALUTAMIDE 40 MG PO CAPS: 120.0000 mg | ORAL_CAPSULE | Freq: Every day | ORAL | Status: DC

## 2014-12-19 ENCOUNTER — Other Ambulatory Visit: Payer: Self-pay | Admitting: *Deleted

## 2014-12-19 DIAGNOSIS — C61 Malignant neoplasm of prostate: Secondary | ICD-10-CM

## 2014-12-19 DIAGNOSIS — C7951 Secondary malignant neoplasm of bone: Secondary | ICD-10-CM

## 2014-12-19 MED ORDER — ENZALUTAMIDE 40 MG PO CAPS: 120.0000 mg | ORAL_CAPSULE | Freq: Every day | ORAL | Status: DC

## 2015-01-18 ENCOUNTER — Other Ambulatory Visit: Payer: Self-pay | Admitting: *Deleted

## 2015-01-18 DIAGNOSIS — C7951 Secondary malignant neoplasm of bone: Secondary | ICD-10-CM

## 2015-01-18 DIAGNOSIS — C61 Malignant neoplasm of prostate: Secondary | ICD-10-CM

## 2015-01-18 MED ORDER — ENZALUTAMIDE 40 MG PO CAPS: 120.0000 mg | ORAL_CAPSULE | Freq: Every day | ORAL | Status: DC

## 2015-02-20 ENCOUNTER — Other Ambulatory Visit: Payer: Self-pay | Admitting: *Deleted

## 2015-02-20 DIAGNOSIS — C61 Malignant neoplasm of prostate: Secondary | ICD-10-CM

## 2015-02-20 DIAGNOSIS — C7951 Secondary malignant neoplasm of bone: Secondary | ICD-10-CM

## 2015-02-20 MED ORDER — ENZALUTAMIDE 40 MG PO CAPS: 120.0000 mg | ORAL_CAPSULE | Freq: Every day | ORAL | Status: DC

## 2015-03-21 ENCOUNTER — Other Ambulatory Visit: Payer: Self-pay | Admitting: *Deleted

## 2015-03-21 DIAGNOSIS — C7951 Secondary malignant neoplasm of bone: Secondary | ICD-10-CM

## 2015-03-21 DIAGNOSIS — C61 Malignant neoplasm of prostate: Secondary | ICD-10-CM

## 2015-03-21 MED ORDER — ENZALUTAMIDE 40 MG PO CAPS: 120.0000 mg | ORAL_CAPSULE | Freq: Every day | ORAL | Status: DC

## 2015-04-09 ENCOUNTER — Telehealth: Payer: Self-pay | Admitting: Oncology

## 2015-04-09 NOTE — Telephone Encounter (Signed)
S/w pt and pt's wife re appt chg from 11/17 to 11/29. Gave appt 11/29 @ 12.45pm. Pt verbalized understanding.

## 2015-04-24 ENCOUNTER — Other Ambulatory Visit: Payer: Self-pay | Admitting: *Deleted

## 2015-04-24 DIAGNOSIS — C61 Malignant neoplasm of prostate: Secondary | ICD-10-CM

## 2015-04-24 DIAGNOSIS — C7951 Secondary malignant neoplasm of bone: Secondary | ICD-10-CM

## 2015-04-24 MED ORDER — ENZALUTAMIDE 40 MG PO CAPS: 120.0000 mg | ORAL_CAPSULE | Freq: Every day | ORAL | Status: DC

## 2015-04-29 ENCOUNTER — Encounter: Payer: Self-pay | Admitting: Oncology

## 2015-04-29 NOTE — Progress Notes (Signed)
Per [facility] xtandi shipped via [facility]

## 2015-05-02 ENCOUNTER — Emergency Department (HOSPITAL_COMMUNITY): Payer: Medicare Other

## 2015-05-02 ENCOUNTER — Encounter (HOSPITAL_COMMUNITY): Payer: Self-pay | Admitting: Emergency Medicine

## 2015-05-02 ENCOUNTER — Inpatient Hospital Stay (HOSPITAL_COMMUNITY)
Admission: EM | Admit: 2015-05-02 | Discharge: 2015-05-06 | DRG: 194 | Disposition: A | Discharge status: Skilled Nursing Facility | Payer: Medicare Other | Attending: Internal Medicine | Admitting: Internal Medicine

## 2015-05-02 DIAGNOSIS — Z993 Dependence on wheelchair: Secondary | ICD-10-CM

## 2015-05-02 DIAGNOSIS — Z87891 Personal history of nicotine dependence: Secondary | ICD-10-CM

## 2015-05-02 DIAGNOSIS — E039 Hypothyroidism, unspecified: Secondary | ICD-10-CM

## 2015-05-02 DIAGNOSIS — G9340 Encephalopathy, unspecified: Secondary | ICD-10-CM | POA: Diagnosis not present

## 2015-05-02 DIAGNOSIS — R001 Bradycardia, unspecified: Secondary | ICD-10-CM | POA: Diagnosis present

## 2015-05-02 DIAGNOSIS — Z23 Encounter for immunization: Secondary | ICD-10-CM

## 2015-05-02 DIAGNOSIS — F32A Depression, unspecified: Secondary | ICD-10-CM

## 2015-05-02 DIAGNOSIS — Z8546 Personal history of malignant neoplasm of prostate: Secondary | ICD-10-CM

## 2015-05-02 DIAGNOSIS — F0390 Unspecified dementia without behavioral disturbance: Secondary | ICD-10-CM | POA: Diagnosis present

## 2015-05-02 DIAGNOSIS — M1990 Unspecified osteoarthritis, unspecified site: Secondary | ICD-10-CM | POA: Diagnosis present

## 2015-05-02 DIAGNOSIS — C61 Malignant neoplasm of prostate: Secondary | ICD-10-CM | POA: Diagnosis present

## 2015-05-02 DIAGNOSIS — R0902 Hypoxemia: Secondary | ICD-10-CM | POA: Diagnosis present

## 2015-05-02 DIAGNOSIS — R4182 Altered mental status, unspecified: Secondary | ICD-10-CM

## 2015-05-02 DIAGNOSIS — F329 Major depressive disorder, single episode, unspecified: Secondary | ICD-10-CM

## 2015-05-02 DIAGNOSIS — R5381 Other malaise: Secondary | ICD-10-CM

## 2015-05-02 DIAGNOSIS — C7951 Secondary malignant neoplasm of bone: Secondary | ICD-10-CM | POA: Diagnosis present

## 2015-05-02 DIAGNOSIS — J189 Pneumonia, unspecified organism: Secondary | ICD-10-CM | POA: Diagnosis present

## 2015-05-02 LAB — APTT: aPTT: 31 seconds (ref 24–37)

## 2015-05-02 LAB — I-STAT TROPONIN, ED: TROPONIN I, POC: 0.03 ng/mL (ref 0.00–0.08)

## 2015-05-02 LAB — DIFFERENTIAL
Basophils Absolute: 0.1 10*3/uL (ref 0.0–0.1)
Basophils Relative: 1 %
EOS ABS: 0.4 10*3/uL (ref 0.0–0.7)
EOS PCT: 5 %
LYMPHS ABS: 1.7 10*3/uL (ref 0.7–4.0)
Lymphocytes Relative: 20 %
MONOS PCT: 8 %
Monocytes Absolute: 0.7 10*3/uL (ref 0.1–1.0)
Neutro Abs: 5.8 10*3/uL (ref 1.7–7.7)
Neutrophils Relative %: 66 %

## 2015-05-02 LAB — COMPREHENSIVE METABOLIC PANEL
ALT: 9 U/L — ABNORMAL LOW (ref 17–63)
ANION GAP: 10 (ref 5–15)
AST: 19 U/L (ref 15–41)
Albumin: 3 g/dL — ABNORMAL LOW (ref 3.5–5.0)
Alkaline Phosphatase: 50 U/L (ref 38–126)
BILIRUBIN TOTAL: 0.6 mg/dL (ref 0.3–1.2)
BUN: 16 mg/dL (ref 6–20)
CO2: 27 mmol/L (ref 22–32)
Calcium: 9.1 mg/dL (ref 8.9–10.3)
Chloride: 101 mmol/L (ref 101–111)
Creatinine, Ser: 0.93 mg/dL (ref 0.61–1.24)
Glucose, Bld: 109 mg/dL — ABNORMAL HIGH (ref 65–99)
POTASSIUM: 4.1 mmol/L (ref 3.5–5.1)
Sodium: 138 mmol/L (ref 135–145)
TOTAL PROTEIN: 6.4 g/dL — AB (ref 6.5–8.1)

## 2015-05-02 LAB — PROTIME-INR
INR: 1.09 (ref 0.00–1.49)
Prothrombin Time: 14.3 seconds (ref 11.6–15.2)

## 2015-05-02 LAB — CBC
HEMATOCRIT: 38.4 % — AB (ref 39.0–52.0)
HEMOGLOBIN: 12.5 g/dL — AB (ref 13.0–17.0)
MCH: 30.3 pg (ref 26.0–34.0)
MCHC: 32.6 g/dL (ref 30.0–36.0)
MCV: 93 fL (ref 78.0–100.0)
Platelets: 272 10*3/uL (ref 150–400)
RBC: 4.13 MIL/uL — ABNORMAL LOW (ref 4.22–5.81)
RDW: 14.1 % (ref 11.5–15.5)
WBC: 8.7 10*3/uL (ref 4.0–10.5)

## 2015-05-02 LAB — ETHANOL

## 2015-05-02 MED ORDER — ACETAMINOPHEN 325 MG PO TABS: 650.0000 mg | ORAL_TABLET | Freq: Four times a day (QID) | ORAL | Status: DC | PRN

## 2015-05-02 MED ORDER — DEXTROSE 5 % IV SOLN
1.0000 g | Freq: Once | INTRAVENOUS | Status: AC
  Administered 2015-05-02: 1 g via INTRAVENOUS
  Filled 2015-05-02: qty 10

## 2015-05-02 MED ORDER — ACETAMINOPHEN 650 MG RE SUPP: 650.0000 mg | Freq: Four times a day (QID) | RECTAL | Status: DC | PRN

## 2015-05-02 MED ORDER — CITALOPRAM HYDROBROMIDE 20 MG PO TABS
10.0000 mg | ORAL_TABLET | Freq: Every day | ORAL | Status: DC
  Administered 2015-05-02 – 2015-05-05 (×4): 10 mg via ORAL
  Filled 2015-05-02 (×5): qty 1

## 2015-05-02 MED ORDER — DEXTROSE 5 % IV SOLN
1.0000 g | INTRAVENOUS | Status: DC
  Administered 2015-05-03 – 2015-05-06 (×4): 1 g via INTRAVENOUS
  Filled 2015-05-02 (×6): qty 10

## 2015-05-02 MED ORDER — ENOXAPARIN SODIUM 40 MG/0.4ML ~~LOC~~ SOLN
40.0000 mg | SUBCUTANEOUS | Status: DC
  Administered 2015-05-02 – 2015-05-06 (×5): 40 mg via SUBCUTANEOUS
  Filled 2015-05-02 (×5): qty 0.4

## 2015-05-02 MED ORDER — AZITHROMYCIN 500 MG PO TABS
500.0000 mg | ORAL_TABLET | ORAL | Status: DC
  Administered 2015-05-04 – 2015-05-05 (×2): 500 mg via ORAL
  Filled 2015-05-02 (×9): qty 1

## 2015-05-02 MED ORDER — ENZALUTAMIDE 40 MG PO CAPS
120.0000 mg | ORAL_CAPSULE | Freq: Every day | ORAL | Status: DC
  Administered 2015-05-04 – 2015-05-05 (×2): 120 mg via ORAL
  Filled 2015-05-02: qty 3

## 2015-05-02 MED ORDER — ONDANSETRON HCL 4 MG PO TABS: 4.0000 mg | ORAL_TABLET | Freq: Four times a day (QID) | ORAL | Status: DC | PRN

## 2015-05-02 MED ORDER — DEXTROSE 5 % IV SOLN
500.0000 mg | Freq: Once | INTRAVENOUS | Status: AC
  Administered 2015-05-02: 500 mg via INTRAVENOUS
  Filled 2015-05-02: qty 500

## 2015-05-02 MED ORDER — SODIUM CHLORIDE 0.9 % IJ SOLN
3.0000 mL | Freq: Two times a day (BID) | INTRAMUSCULAR | Status: DC
  Administered 2015-05-02 – 2015-05-05 (×3): 3 mL via INTRAVENOUS

## 2015-05-02 MED ORDER — LEVOTHYROXINE SODIUM 100 MCG PO TABS
100.0000 ug | ORAL_TABLET | Freq: Every day | ORAL | Status: DC
  Administered 2015-05-04 – 2015-05-06 (×2): 100 ug via ORAL
  Filled 2015-05-02 (×4): qty 1

## 2015-05-02 MED ORDER — SODIUM CHLORIDE 0.9 % IJ SOLN: 3.0000 mL | INTRAMUSCULAR | Status: DC | PRN

## 2015-05-02 MED ORDER — ONDANSETRON HCL 4 MG/2ML IJ SOLN: 4.0000 mg | Freq: Four times a day (QID) | INTRAMUSCULAR | Status: DC | PRN

## 2015-05-02 MED ORDER — SODIUM CHLORIDE 0.9 % IV SOLN: 250.0000 mL | INTRAVENOUS | Status: DC | PRN

## 2015-05-02 NOTE — ED Notes (Signed)
Per EMS: pt from home with increased generalized weakness x 2 days; pt appears to be leaning to left but no obvious unilateral weakness

## 2015-05-02 NOTE — ED Notes (Signed)
Patient transported to CT 

## 2015-05-02 NOTE — ED Provider Notes (Signed)
CSN: [id number]     Arrival date & time 05/02/15  [id number] History   First MD Initiated Contact with Patient 05/02/15 0840     Chief Complaint  Patient presents with  . Weakness     (Consider location/radiation/quality/duration/timing/severity/associated sxs/prior Treatment) HPI   Patient is an 81 year old male with past medical history significant for prostate cancer, ventral hernia, osteoarthritis for which he is limited in motion and requires a wheelchair.  Patient presents with son who does not live with him. He does live with his wife and another son. According to son with him he's been increasingly weak at home. They called the fire department to get him out of the chair and into his bed. However fire department felt like he was requiring medical care.  Patient  is mildly confused, oriented to place and person but not time. Unable to give a good history.  Son states she's been increasingly weak at home. He said that last night he noticed he was unable to pivot/stand as usual. Son thinks think he's had a gradual decline in mental capabilities.  L5 caveat dementia   Past Medical History  Diagnosis Date  . Broken ankle 2001  . Broken leg 1999  . Ventral hernia   . Cancer [facility])     prostate  . Prostate cancer [facility])    Past Surgical History  Procedure Laterality Date  . Appendectomy  1948  . Hernia repair  03/14/10    LIH  . Prostate surgery  12/2009   Family History  Problem Relation Age of Onset  . Cancer Mother     oral   Social History  Substance Use Topics  . Smoking status: Former Research scientist (life sciences)  . Smokeless tobacco: None     Comment: quit 1987  . Alcohol Use: No    Review of Systems  Unable to perform ROS: Dementia      Allergies  Review of patient's allergies indicates no known allergies.  Home Medications   Prior to Admission medications   Medication Sig Start Date End Date Taking? Authorizing Provider  Calcium Carbonate-Vitamin D (CALCIUM + D PO) Take by  mouth daily.      Historical Provider, MD  citalopram (CELEXA) 10 MG tablet  12/14/13   Historical Provider, MD  Cyanocobalamin (VITAMIN B 12 PO) Take 1 tablet by mouth.    Historical Provider, MD  enzalutamide Gillermina Phy) 40 MG capsule Take 3 capsules (120 mg total) by mouth daily. 04/24/15   Wyatt Portela, MD  levothyroxine (SYNTHROID, LEVOTHROID) 100 MCG tablet Take 100 mcg by mouth daily. 04/17/14   Historical Provider, MD  naproxen sodium (ANAPROX) 220 MG tablet Take 220 mg by mouth every 8 (eight) hours as needed (pain).    Historical Provider, MD   BP 126/73 mmHg  Pulse 41  Temp(Src) 97.8 F (36.6 C) (Oral)  Resp 16  SpO2 89% Physical Exam  Constitutional: He appears well-nourished.  HENT:  Head: Normocephalic.  Mouth/Throat: Oropharynx is clear and moist.  Patient has stains from chewing tobacco on his bilateral chin.  Eyes: Conjunctivae are normal.  Neck: No tracheal deviation present.  Cardiovascular: Normal rate.   Pulmonary/Chest: Effort normal. No stridor. No respiratory distress.  Abdominal: Soft. There is no tenderness. There is no guarding.  Musculoskeletal: Normal range of motion. He exhibits no edema.  Patient barely able to lift arms off the bed bilaterally. Same with bilateral legs. Unable to test for pronator drift or finger/nose because of  weakness  Neurological:  Oriented  to person and to year.  Patient leaning to the left. Not attentive to things happening on the left-hand side of his body.  Skin: Skin is warm and dry. No rash noted. He is not diaphoretic.  Psychiatric: He has a normal mood and affect. His behavior is normal.  Nursing note and vitals reviewed.   ED Course  Procedures (including critical care time) Labs Review Labs Reviewed  CBC - Abnormal; Notable for the following:    RBC 4.13 (*)    Hemoglobin 12.5 (*)    HCT 38.4 (*)    All other components within normal limits  COMPREHENSIVE METABOLIC PANEL - Abnormal; Notable for the following:     Glucose, Bld 109 (*)    Total Protein 6.4 (*)    Albumin 3.0 (*)    ALT 9 (*)    All other components within normal limits  ETHANOL  PROTIME-INR  APTT  DIFFERENTIAL  URINE RAPID DRUG SCREEN, HOSP PERFORMED  URINALYSIS, ROUTINE W REFLEX MICROSCOPIC (NOT AT [facility])  Randolm Idol, ED    Imaging Review Dg Chest 2 View  05/02/2015  CLINICAL DATA:  Progressive weakness. Chest pain and cough. Prostate cancer. EXAM: CHEST - 2 VIEW COMPARISON:  Two-view chest x-ray 12/18/2009 FINDINGS: The heart size is normal. Asymmetric interstitial coarsening is present on the left. A left pleural effusion is suspected. Degenerate changes are noted at the shoulders bilaterally. IMPRESSION: 1. Asymmetric interstitial and airspace disease in the left lung is concerning for pneumonia. Electronically Signed   By: San Morelle M.D.   On: 05/02/2015 09:56   Ct Head Wo Contrast  05/02/2015  CLINICAL DATA:  Generalized weakness for 2 days EXAM: CT HEAD WITHOUT CONTRAST TECHNIQUE: Contiguous axial images were obtained from the base of the skull through the vertex without intravenous contrast. COMPARISON:  None. FINDINGS: The bony calvarium is intact. Some scattered areas of sclerosis are noted which may be related to the patient's known history of prior metastatic prostate carcinoma. Diffuse atrophic changes are seen. Chronic white matter ischemic changes are noted. No acute hemorrhage, acute infarction or space-occupying mass lesion is identified. IMPRESSION: Chronic atrophic and ischemic changes. No acute intracranial abnormality is noted. Sclerotic foci within the skullbase which may be related to the previous history of metastatic prostate carcinoma. Electronically Signed   By: Inez Catalina M.D.   On: 05/02/2015 10:11   I have personally reviewed and evaluated these images and lab results as part of my medical decision-making.   EKG Interpretation   Date/Time:  Thursday May 02 2015 08:41:03  EST Ventricular Rate:  56 PR Interval:  191 QRS Duration: 109 QT Interval:  488 QTC Calculation: 471 R Axis:   38 Text Interpretation:  Sinus rhythm Anteroseptal infarct, age indeterminate  no acute ischemia No significant change since last tracing Confirmed by  Gerald Leitz ([id number]) on 05/02/2015 9:59:30 AM      MDM   Final diagnoses:  None    Patient is a 81 year old male presenting with weakness. It is difficult to tell how focal weaknesses. Currently it seems very global however patient is leaning to left and may have some kind of left hemi-neglect. It sounds as if the symptoms started yesterday. We will do stroke workup as well as a UA chest x-ray to rule out any kind of occult infection that could be causing globalized weakness. Patient is outside the code-stroke window.  10:22 AM X-ray shows pneumonia.Patient desats to 88% on RA. Patient is very globally weak. Would like  to admit for continued acquired pneumonia but also potentially to get MRI given patient's weakness and left-sided lean.  Ming Kunka Julio Alm, MD 05/02/15 1023

## 2015-05-02 NOTE — ED Notes (Signed)
Attempted to I&O cath pt. Unable to get catheter in rt anatomy.

## 2015-05-02 NOTE — Progress Notes (Signed)
Utilization review completed. Tanika Bracco, RN, BSN. 

## 2015-05-02 NOTE — ED Notes (Signed)
Tried to in [place] cath unable to cath.ATthis time.will give urinal to pts family member in case he has to void.

## 2015-05-02 NOTE — ED Notes (Signed)
Family at bedside. 

## 2015-05-02 NOTE — Progress Notes (Signed)
Physical Therapy Treatment Patient Details Name: Stephen English MRN: [id number] DOB: Mar 20, 1925 Today's Date: 05/02/2015    History of Present Illness 81 y.o. male admitted with community acquired pneumonia, acute encephalopathy, and generalized weakness.    PT Comments    Pt admitted with the above complications. Pt currently with functional limitations due to the deficits listed below (see PT Problem List). Required max assist to stand and LEs buckle quickly. Disoriented has difficulty following commands. Anticipate pt will need SNF at d/c due to significant weakness. Difficulty assessing for focal deficits due to impaired cognition but does demonstrate a slight left lateral lean while working with therapy today. SpO2 98% on 2L supplemental O2, 88% on room air. Pt will benefit from skilled PT to increase their independence and safety with mobility to allow discharge to the venue listed below.     Follow Up Recommendations  SNF;Supervision/Assistance - 24 hour     Equipment Recommendations   (TBD)    Recommendations for Other Services       Precautions / Restrictions Precautions Precautions: Fall Restrictions Weight Bearing Restrictions: No    Mobility  Bed Mobility Overal bed mobility: Needs Assistance Bed Mobility: Supine to Sit;Sit to Supine     Supine to sit: Mod assist Sit to supine: Min assist   General bed mobility comments: Mod assist for truncal support and to pull through PTs hand to achieve EOB sitting. Min assist for LE support back into bed. Cues for technique.  Transfers Overall transfer level: Needs assistance Equipment used: Rolling walker (2 wheeled) (chair) Transfers: Sit to/from Stand Sit to Stand: Max assist;From elevated surface         General transfer comment: Able to perform with max assist 2 out of 3 times. Max VC for technique, sequencing and tactile cues to facilitate. LEs do buckle and he is unable to tolerate more than several seconds  at a time. Poor ability to sequence hands and hold RW or back of chair for support.  Ambulation/Gait                 Stairs            Wheelchair Mobility    Modified Rankin (Stroke Patients Only)       Balance Overall balance assessment: Needs assistance Sitting-balance support: No upper extremity supported;Feet supported Sitting balance-Leahy Scale: Fair     Standing balance support: Bilateral upper extremity supported Standing balance-Leahy Scale: Poor                      Cognition Arousal/Alertness: Awake/alert Behavior During Therapy: Flat affect Overall Cognitive Status: No family/caregiver present to determine baseline cognitive functioning Area of Impairment: Orientation;Following commands;Problem solving Orientation Level: Disoriented to;Place;Time;Situation;Person   Memory: Decreased short-term memory Following Commands: Follows one step commands with increased time;Follows one step commands inconsistently     Problem Solving: Slow processing;Decreased initiation;Difficulty sequencing;Requires verbal cues;Requires tactile cues      Exercises General Exercises - Lower Extremity Long Arc Quad: AAROM;Strengthening;Both;10 reps Hip Flexion/Marching: AAROM;Strengthening;Both;10 reps;Seated    General Comments General comments (skin integrity, edema, etc.): SpO2 98% on 2L at rest, 88% on room air.      Pertinent Vitals/Pain Pain Assessment: No/denies pain    Home Living Family/patient expects to be discharged to:: Unsure               Additional Comments: Patient unable to provide accurate information at this time. no family present. From previous notes, he lives  with his wife and has become increasingly weak recently.    Prior Function Level of Independence:  (see below)      Comments: Patient unable to provide accurate information at this time. no family present. From previous notes, he lives with his wife and has become  increasingly weak recently.   PT Goals (current goals can now be found in the care plan section) Acute Rehab PT Goals Patient Stated Goal: None stated PT Goal Formulation: Patient unable to participate in goal setting Time For Goal Achievement: 05/16/15 Potential to Achieve Goals: Fair    Frequency  Min 2X/week    PT Plan      Co-evaluation             End of Session Equipment Utilized During Treatment: Gait belt;Oxygen Activity Tolerance: Patient limited by fatigue Patient left: in bed;with call bell/phone within reach;with bed alarm set     Time: [id number] PT Time Calculation (min) (ACUTE ONLY): 21 min  Charges:                       G Codes:      Ellouise Newer 07-May-2015, 3:41 PM  Camille [place], [place]

## 2015-05-02 NOTE — Evaluation (Addendum)
Clinical/Bedside Swallow Evaluation Patient Details  Name: Stephen English MRN: [id number] Date of Birth: 1925/03/07  Today's Date: 05/02/2015 Time: SLP Start Time (ACUTE ONLY): 49 SLP Stop Time (ACUTE ONLY): 1525 SLP Time Calculation (min) (ACUTE ONLY): 15 min  Past Medical History:  Past Medical History  Diagnosis Date  . Broken ankle 2001  . Broken leg 1999  . Ventral hernia   . Cancer [facility])     prostate  . Prostate cancer [facility])    Past Surgical History:  Past Surgical History  Procedure Laterality Date  . Appendectomy  1948  . Hernia repair  03/14/10    LIH  . Prostate surgery  12/2009   HPI:  81 y.o. male who presents with altered mental status and generalized weakness   Assessment / Plan / Recommendation Clinical Impression   Pt presents with grossly intact oropharyngeal swallowing function.  No overt s/s of aspiration were evident with thin liquids when pt was challenged with large, consecutive boluses via straw.  Pt demonstrated effective mastication and oral manipulation of purees and solid textures with complete clearance of boluses from the oral cavity post swallow.  Recommend that pt remain on regular textures with thin liquids.  Pt will need full supervision for use of swallowing precautions due to overt cognitive impairment. Suspect cognitive deficits to be baseline but are now likely exacerbated due to pt being in an unfamiliar environment and in the setting of acute illness.  SLP is hopeful that as pt becomes more medically stable these deficits will resolve without intervention.  No further SLP needs are indicated for dysphagia; however, should pt's cognition worsen or become of concern to medical team, please feel free to re-consult for cognitive evaluation.      Aspiration Risk  Mild aspiration risk    Diet Recommendation  Age appropriate solids, thin liquids   Medication Administration: Whole meds with liquid    Other  Recommendations Oral Care  Recommendations: Oral care BID   Follow up Recommendations   None          Swallow Study   General HPI: 81 y.o. male who presents with altered mental status and generalized weakness Type of Study: Bedside Swallow Evaluation Previous Swallow Assessment: none on record  Diet Prior to this Study: Regular;Thin liquids Temperature Spikes Noted: No Respiratory Status: Nasal cannula History of Recent Intubation: No Behavior/Cognition: Alert;Confused;Cooperative;Pleasant mood;Requires cueing Oral Cavity Assessment: Within Functional Limits Oral Cavity - Dentition: Poor condition;Missing dentition Vision: Functional for self-feeding Self-Feeding Abilities: Able to feed self;Needs assist Patient Positioning: Upright in bed Baseline Vocal Quality: Low vocal intensity Volitional Cough: Cognitively unable to elicit Volitional Swallow: Unable to elicit    Oral/Motor/Sensory Function Overall Oral Motor/Sensory Function: Within functional limits   Ice Chips     Thin Liquid Thin Liquid: Within functional limits Presentation: Straw    Nectar Thick     Honey Thick     Puree Puree: Within functional limits Presentation: Spoon   Solid Solid: Within functional limits Presentation: Self Fed       Stephen English, Stephen English 05/02/2015,3:47 PM

## 2015-05-02 NOTE — Progress Notes (Signed)
Son mark Cadieux called to bring in pt. Medication from home. Pharmacy do no carry his Xtandi. Left a message on voicemail.

## 2015-05-02 NOTE — H&P (Addendum)
[facility] History and Physical  Garrett Fralix [facility] [id number] DOB: 12-02-1924 DOA: 05/02/2015  Referring physician: Dr Thomasene Lot - [facility] PCP: Abigail Miyamoto, MD   Chief Complaint: Generalized weakness and confusion  HPI: Stephen English is a 81 y.o. male  Level V caveat: Patient presenting in altered mental state and only able to give limited information with regards to his current medical condition. History provided by family members with patient as well as ED physician.  Patient lives at home with his wife. Over the last several days patient is becoming increasingly weak. Family is noted that patient appears to be leaning more to one side during ambulation. This morning family were unable to get patient out of bed because of his weakness. Nothing makes his symptoms better. Nothing makes his symptoms worse. Symptoms are getting worse. Family also states patient has had gradual decline in his mental capacities of the last several days. Tolerating oral intake. Patient is without any complaints of chest pain, shortness of breath, focal abnormality, dysuria, back pain, LOC, fall. Our department was called to evaluate patient was felt that ED evaluation was needed.   Review of Systems:  Per HPI. Unable to obtain further history due to pts altered condition  Past Medical History  Diagnosis Date  . Broken ankle 2001  . Broken leg 1999  . Ventral hernia   . Cancer [facility])     prostate  . Prostate cancer [facility])    Past Surgical History  Procedure Laterality Date  . Appendectomy  1948  . Hernia repair  03/14/10    LIH  . Prostate surgery  12/2009   Social History:  reports that he has quit smoking. He does not have any smokeless tobacco history on file. He reports that he does not drink alcohol or use illicit drugs.  No Known Allergies  Family History  Problem Relation Age of Onset  . Cancer Mother     oral     Prior to Admission medications   Medication Sig Start Date End  Date Taking? Authorizing Provider  Calcium Carbonate-Vitamin D (CALCIUM + D PO) Take by mouth daily.      Historical Provider, MD  citalopram (CELEXA) 10 MG tablet  12/14/13   Historical Provider, MD  Cyanocobalamin (VITAMIN B 12 PO) Take 1 tablet by mouth.    Historical Provider, MD  enzalutamide Gillermina Phy) 40 MG capsule Take 3 capsules (120 mg total) by mouth daily. 04/24/15   Wyatt Portela, MD  levothyroxine (SYNTHROID, LEVOTHROID) 100 MCG tablet Take 100 mcg by mouth daily. 04/17/14   Historical Provider, MD  naproxen sodium (ANAPROX) 220 MG tablet Take 220 mg by mouth every 8 (eight) hours as needed (pain).    Historical Provider, MD   Physical Exam: Filed Vitals:   05/02/15 1015 05/02/15 1016 05/02/15 1045 05/02/15 1113  BP: 126/73  143/74 114/66  Pulse: 41  65 56  Temp:    97.6 F (36.4 C)  TempSrc:    Oral  Resp: 16  20 15   SpO2:  89% 96% 99%    Wt Readings from Last 3 Encounters:  11/10/13 99.61 kg (219 lb 9.6 oz)  10/06/13 100.699 kg (222 lb)  08/25/13 101.47 kg (223 lb 11.2 oz)    General: Elderly and frail. Appears calm and comfortable Eyes:  EOMI, normal lids, iris ENT: Dry mm Neck:  no LAD, masses or thyromegaly Cardiovascular:  Very difficult to hear. RRR, no m/r/g. No LE edema.  Respiratory:  Decreased LL  breath sounds w/ few crackles. No wheezing on 2L [place]. nml effort  Abdomen:  soft, ntnd, large reducible ventral hernia.  Skin:  no rash or induration seen on limited exam Musculoskeletal: Global weakness. No focal abnormality noted. Unable to roll from side to side or sit unassisted. Symmetric Psychiatric: Alert and oriented to person and place but unclear of any symptoms he may be having or what is going on over the last several days or of date. Answers most questions appropriately but is fairly tangential at times. Neurologic:  CN 2-12 grossly intact, moves all extremities in coordinated fashion.          Labs on Admission:  Basic Metabolic Panel:  Recent  Labs Lab 05/02/15 0901  NA 138  K 4.1  CL 101  CO2 27  GLUCOSE 109*  BUN 16  CREATININE 0.93  CALCIUM 9.1   Liver Function Tests:  Recent Labs Lab 05/02/15 0901  AST 19  ALT 9*  ALKPHOS 50  BILITOT 0.6  PROT 6.4*  ALBUMIN 3.0*   No results for input(s): LIPASE, AMYLASE in the last 168 hours. No results for input(s): AMMONIA in the last 168 hours. CBC:  Recent Labs Lab 05/02/15 0901  WBC 8.7  NEUTROABS 5.8  HGB 12.5*  HCT 38.4*  MCV 93.0  PLT 272   Cardiac Enzymes: No results for input(s): CKTOTAL, CKMB, CKMBINDEX, TROPONINI in the last 168 hours.  BNP (last 3 results) No results for input(s): BNP in the last 8760 hours.  ProBNP (last 3 results) No results for input(s): PROBNP in the last 8760 hours.  CBG: No results for input(s): GLUCAP in the last 168 hours.  Radiological Exams on Admission: Dg Chest 2 View  05/02/2015  CLINICAL DATA:  Progressive weakness. Chest pain and cough. Prostate cancer. EXAM: CHEST - 2 VIEW COMPARISON:  Two-view chest x-ray 12/18/2009 FINDINGS: The heart size is normal. Asymmetric interstitial coarsening is present on the left. A left pleural effusion is suspected. Degenerate changes are noted at the shoulders bilaterally. IMPRESSION: 1. Asymmetric interstitial and airspace disease in the left lung is concerning for pneumonia. Electronically Signed   By: San Morelle M.D.   On: 05/02/2015 09:56   Ct Head Wo Contrast  05/02/2015  CLINICAL DATA:  Generalized weakness for 2 days EXAM: CT HEAD WITHOUT CONTRAST TECHNIQUE: Contiguous axial images were obtained from the base of the skull through the vertex without intravenous contrast. COMPARISON:  None. FINDINGS: The bony calvarium is intact. Some scattered areas of sclerosis are noted which may be related to the patient's known history of prior metastatic prostate carcinoma. Diffuse atrophic changes are seen. Chronic white matter ischemic changes are noted. No acute hemorrhage,  acute infarction or space-occupying mass lesion is identified. IMPRESSION: Chronic atrophic and ischemic changes. No acute intracranial abnormality is noted. Sclerotic foci within the skullbase which may be related to the previous history of metastatic prostate carcinoma. Electronically Signed   By: Inez Catalina M.D.   On: 05/02/2015 10:11   Assessment/Plan Principal Problem:   CAP (community acquired pneumonia) Active Problems:   Prostate cancer ([place])   Bone metastasis ([place])   Acute encephalopathy   Physical deconditioning   Hypothyroid   Depression   Hypoxia   CAP: CXR concerning for pneumonia. Nml WBC at 8.7 but w/ new O2 requirement adn altered state. Afebrile and hemodynamically stable.  - Med surge - CTX, Azithro - O2 PRN - Sputum culture, Legionella and strep pneumo antigen  Acute encephalopathy: mild. Likely from  CAP adn undiagnosed underlying mild dementia. Head CT w/o acute process. Unlikely stroke (some early reports of possible unilateral weakness but symmetrical). - treatment as above. - UA - outpt MMSE - +/- MRI if worsens or becomes localized to 1 side.   Generalized weakness: Age-related but worsened by infection as noted above. - PT/OT - Nutrition consult  Prostate cancer: - continue Xtandi  Hypothyroid: - cont synthroid  Depression: - cont celexa  Bradycardia: noted previously in history.  - monitor   Code Status: FULL - reassess when family available.  DVT Prophylaxis: Lovenox Family Communication: none - family not available at time of evaluation Disposition Plan:  Pending Improvement    Stephen English J, MD Family Medicine [facility] [email or web address] Password [email or web address]

## 2015-05-03 DIAGNOSIS — J189 Pneumonia, unspecified organism: Principal | ICD-10-CM

## 2015-05-03 LAB — CBC
HCT: 33.7 % — ABNORMAL LOW (ref 39.0–52.0)
Hemoglobin: 11 g/dL — ABNORMAL LOW (ref 13.0–17.0)
MCH: 30.3 pg (ref 26.0–34.0)
MCHC: 32.6 g/dL (ref 30.0–36.0)
MCV: 92.8 fL (ref 78.0–100.0)
PLATELETS: 269 10*3/uL (ref 150–400)
RBC: 3.63 MIL/uL — AB (ref 4.22–5.81)
RDW: 14.2 % (ref 11.5–15.5)
WBC: 6.8 10*3/uL (ref 4.0–10.5)

## 2015-05-03 LAB — BASIC METABOLIC PANEL
ANION GAP: 7 (ref 5–15)
BUN: 11 mg/dL (ref 6–20)
CO2: 29 mmol/L (ref 22–32)
Calcium: 8.7 mg/dL — ABNORMAL LOW (ref 8.9–10.3)
Chloride: 104 mmol/L (ref 101–111)
Creatinine, Ser: 0.8 mg/dL (ref 0.61–1.24)
GFR calc Af Amer: >60 mL/min (ref >60)
GLUCOSE: 103 mg/dL — AB (ref 65–99)
POTASSIUM: 3.8 mmol/L (ref 3.5–5.1)
SODIUM: 140 mmol/L (ref 135–145)

## 2015-05-03 LAB — BLOOD GAS, ARTERIAL
Acid-Base Excess: 2 mmol/L (ref 0.0–2.0)
BICARBONATE: 26.5 meq/L — AB (ref 20.0–24.0)
DRAWN BY: 270271
O2 Content: 2 L/min
O2 SAT: 98.8 %
PH ART: 7.389 (ref 7.350–7.450)
Patient temperature: 98.7
TCO2: 27.9 mmol/L (ref 0–100)
pCO2 arterial: 44.9 mmHg (ref 35.0–45.0)
pO2, Arterial: 145 mmHg — ABNORMAL HIGH (ref 80.0–100.0)

## 2015-05-03 LAB — HIV ANTIBODY (ROUTINE TESTING W REFLEX): HIV Screen 4th Generation wRfx: NONREACTIVE

## 2015-05-03 MED ORDER — SODIUM CHLORIDE 0.9 % IV SOLN
INTRAVENOUS | Status: DC
  Administered 2015-05-03 – 2015-05-06 (×4): via INTRAVENOUS

## 2015-05-03 NOTE — Progress Notes (Addendum)
Initial Nutrition Assessment  DOCUMENTATION CODES:   Obesity unspecified  INTERVENTION:   .Advance diet as medically appropriate, add supplements when/as able  NUTRITION DIAGNOSIS:   Inadequate oral intake related to poor appetite as evidenced by meal completion < 50%   GOAL:   Patient will meet greater than or equal to 90% of their needs  MONITOR:   Diet advancement, PO intake, Labs, Weight trends, I & O's  REASON FOR ASSESSMENT:   Consult Assessment of nutrition requirement/status  ASSESSMENT:   81 yo Male presenting with gernalized weakness and confusion.  RD unable to obtain nutrition hx or complete Nutrition Focused Physical Exam.  No family at bedside.  PO intake poor at 25% per flowsheet records (previously on Heart Healthy diet prior to NPO status).    S/p bedside swallow evaluation -- pt with functional swallow.  CXR concerning for PNA.  Would benefit from oral nutrition supplements once/as able.  RD to monitor PO diet advancement.  Diet Order:  Diet NPO time specified Except for: Sips with Meds  Skin:  Reviewed, no issues  Last BM:  N/A  Height:   Ht Readings from Last 1 Encounters:  05/03/15 5\' 9"  (1.753 m)    Weight:   Wt Readings from Last 1 Encounters:  05/02/15 219 lb 9.3 oz (99.6 kg)    Ideal Body Weight:  73 kg  BMI:  Body mass index is 32.41 kg/(m^2).  Estimated Nutritional Needs:   Kcal:  2000-2200  Protein:  100-110 gm  Fluid:  2.0-2.2 L  EDUCATION NEEDS:   No education needs identified at this time  Arthur Holms, RD, LDN Pager #: [phone] After-Hours Pager #: [phone]

## 2015-05-03 NOTE — Progress Notes (Signed)
Patient Demographics  Stephen English, is a 81 y.o. male, DOB - [birth year], [id number]  Admit date - 05/02/2015   Admitting Physician Waldemar Dickens, MD  Outpatient Primary MD for the patient is FRIED, [name], MD  LOS - 1   Chief Complaint  Patient presents with  . Weakness         Subjective:   [facility] today has, No headache, No chest pain, No abdominal pain  Assessment & Plan    Principal Problem:   CAP (community acquired pneumonia) Active Problems:   Prostate cancer ([place])   Bone metastasis ([place])   Acute encephalopathy   Physical deconditioning   Hypothyroid   Depression   Hypoxia  Community-acquired pneumonia - Continue patient on Rocephin and azithromycin - Follow on blood cultures: No growth to date  Acute encephalopathy - This is most likely metabolic secondary to community-acquired pneumonia - CT head with no acute findings - Patient with overall weakness, no focal deficits.  Generalized weakness/deconditioning - PT/OT  History of prostate cancer - Continue with Xtandi  Depression - Continue with Celexa  Hypothyroidism - Continue with Synthroid  Bradycardia - Not on any AV blocking agents, continue to monitor on telemetry.  Code Status: Full  Family Communication: None at bedside  Disposition Plan: Pending PT evaluation   Procedures  None   Consults   None   Medications  Scheduled Meds: . azithromycin  500 mg Oral Q24H  . cefTRIAXone (ROCEPHIN)  IV  1 g Intravenous Q24H  . citalopram  10 mg Oral Daily  . enoxaparin (LOVENOX) injection  40 mg Subcutaneous Q24H  . enzalutamide  120 mg Oral Daily  . levothyroxine  100 mcg Oral QAC breakfast  . sodium chloride  3 mL Intravenous Q12H   Continuous Infusions:  PRN Meds:.sodium chloride, acetaminophen **OR** acetaminophen, ondansetron **OR** ondansetron (ZOFRAN) IV, sodium  chloride  DVT Prophylaxis  Lovenox  Lab Results  Component Value Date   PLT 269 05/03/2015    Antibiotics    Anti-infectives    Start     Dose/Rate Route Frequency Ordered Stop   05/03/15 1000  cefTRIAXone (ROCEPHIN) 1 g in dextrose 5 % 50 mL IVPB     1 g 100 mL/hr over 30 Minutes Intravenous Every 24 hours 05/02/15 1122 05/09/15 0959   05/03/15 1000  azithromycin (ZITHROMAX) tablet 500 mg     500 mg Oral Every 24 hours 05/02/15 1122 05/09/15 0959   05/02/15 1030  cefTRIAXone (ROCEPHIN) 1 g in dextrose 5 % 50 mL IVPB     1 g 100 mL/hr over 30 Minutes Intravenous  Once 05/02/15 1022 05/02/15 1056   05/02/15 1030  azithromycin (ZITHROMAX) 500 mg in dextrose 5 % 250 mL IVPB     500 mg 250 mL/hr over 60 Minutes Intravenous  Once 05/02/15 1022 05/02/15 1215          Objective:   Filed Vitals:   05/02/15 2039 05/03/15 0106 05/03/15 0700 05/03/15 1040  BP: 120/67 105/63 121/65   Pulse: 63 49 50   Temp: 98.2 F (36.8 C)  97.5 F (36.4 C)   TempSrc: Oral  Axillary   Resp: 16  16   Height:    5\' 9"  (1.753 m)  Weight:      SpO2: 98% 100% 100%     Wt Readings from Last 3 Encounters:  05/02/15 99.6 kg (219 lb 9.3 oz)  11/10/13 99.61 kg (219 lb 9.6 oz)  10/06/13 100.699 kg (222 lb)     Intake/Output Summary (Last 24 hours) at 05/03/15 1053 Last data filed at 05/02/15 1832  Gross per 24 hour  Intake      0 ml  Output      0 ml  Net      0 ml     Physical Exam  Frail, elderly, sleeping comfortably, wakes up to verbal stimuli, communicative.  Supple Neck,No JVD,  Symmetrical Chest wall movement, Good air movement bilaterally, no wheezing ,No Gallops,Rubs or new Murmurs, No Parasternal Heave +ve B.Sounds, Abd Soft, No tenderness, No organomegaly appriciated, No rebound - guarding or rigidity. No Cyanosis, Clubbing or edema, no focal deficits could be identified on physical exam, has generalized weakness.   Data Review   Micro Results No results found for this  or any previous visit (from the past 240 hour(s)).  Radiology Reports Dg Chest 2 View  05/02/2015  CLINICAL DATA:  Progressive weakness. Chest pain and cough. Prostate cancer. EXAM: CHEST - 2 VIEW COMPARISON:  Two-view chest x-ray 12/18/2009 FINDINGS: The heart size is normal. Asymmetric interstitial coarsening is present on the left. A left pleural effusion is suspected. Degenerate changes are noted at the shoulders bilaterally. IMPRESSION: 1. Asymmetric interstitial and airspace disease in the left lung is concerning for pneumonia. Electronically Signed   By: San Morelle M.D.   On: 05/02/2015 09:56   Ct Head Wo Contrast  05/02/2015  CLINICAL DATA:  Generalized weakness for 2 days EXAM: CT HEAD WITHOUT CONTRAST TECHNIQUE: Contiguous axial images were obtained from the base of the skull through the vertex without intravenous contrast. COMPARISON:  None. FINDINGS: The bony calvarium is intact. Some scattered areas of sclerosis are noted which may be related to the patient's known history of prior metastatic prostate carcinoma. Diffuse atrophic changes are seen. Chronic white matter ischemic changes are noted. No acute hemorrhage, acute infarction or space-occupying mass lesion is identified. IMPRESSION: Chronic atrophic and ischemic changes. No acute intracranial abnormality is noted. Sclerotic foci within the skullbase which may be related to the previous history of metastatic prostate carcinoma. Electronically Signed   By: Inez Catalina M.D.   On: 05/02/2015 10:11     CBC  Recent Labs Lab 05/02/15 0901 05/03/15 0324  WBC 8.7 6.8  HGB 12.5* 11.0*  HCT 38.4* 33.7*  PLT 272 269  MCV 93.0 92.8  MCH 30.3 30.3  MCHC 32.6 32.6  RDW 14.1 14.2  LYMPHSABS 1.7  --   MONOABS 0.7  --   EOSABS 0.4  --   BASOSABS 0.1  --     Chemistries   Recent Labs Lab 05/02/15 0901 05/03/15 0324  NA 138 140  K 4.1 3.8  CL 101 104  CO2 27 29  GLUCOSE 109* 103*  BUN 16 11  CREATININE 0.93 0.80   CALCIUM 9.1 8.7*  AST 19  --   ALT 9*  --   ALKPHOS 50  --   BILITOT 0.6  --    ------------------------------------------------------------------------------------------------------------------ estimated creatinine clearance is 74.3 mL/min (by C-G formula based on Cr of 0.8). ------------------------------------------------------------------------------------------------------------------ No results for input(s): HGBA1C in the last 72 hours. ------------------------------------------------------------------------------------------------------------------ No results for input(s): CHOL, HDL, LDLCALC, TRIG, CHOLHDL, LDLDIRECT in the last 72 hours. ------------------------------------------------------------------------------------------------------------------ No results for input(s):  TSH, T4TOTAL, T3FREE, THYROIDAB in the last 72 hours.  Invalid input(s): FREET3 ------------------------------------------------------------------------------------------------------------------ No results for input(s): VITAMINB12, FOLATE, FERRITIN, TIBC, IRON, RETICCTPCT in the last 72 hours.  Coagulation profile  Recent Labs Lab 05/02/15 0901  INR 1.09    No results for input(s): DDIMER in the last 72 hours.  Cardiac Enzymes No results for input(s): CKMB, TROPONINI, MYOGLOBIN in the last 168 hours.  Invalid input(s): CK ------------------------------------------------------------------------------------------------------------------ Invalid input(s): POCBNP     Time Spent in minutes   30 minutes   Dorothyann Mourer M.D on 05/03/2015 at 10:53 AM  Between 7am to 7pm - Pager - [phone]  After 7pm go to [email or web address] - password [facility]  [facility]   Office  [phone]

## 2015-05-03 NOTE — Evaluation (Signed)
Occupational Therapy Evaluation Patient Details Name: Stephen English MRN: [id number] DOB: 1924-07-17 Today's Date: 05/03/2015    History of Present Illness 81 y.o. male admitted with community acquired pneumonia, acute encephalopathy, and generalized weakness.   Clinical Impression   Pt admitted with above. He demonstrates the below listed deficits and will benefit from continued OT to maximize safety and independence with BADLs.  Pt presents to OT with impaired cognition, generalized weakness, and impaired balance.  Currently, he requires total A for ADLs and max A +2 for sit to stand.   He will require extensive assist at discharge, and doubt his wife will be able to assist him at this level.  Recommend SNF.      Follow Up Recommendations  SNF;Supervision/Assistance - 24 hour    Equipment Recommendations  None recommended by OT    Recommendations for Other Services       Precautions / Restrictions Precautions Precautions: Fall Restrictions Weight Bearing Restrictions: No      Mobility Bed Mobility Overal bed mobility: Needs Assistance Bed Mobility: Supine to Sit;Sit to Supine     Supine to sit: Mod assist Sit to supine: Max assist   General bed mobility comments: Requires cues and assist to initiate movement.  Assist to move LEs off EOB and assist to lift shoulder.  If provided with increased time, he is able to assist with movement   Transfers Overall transfer level: Needs assistance Equipment used: 2 person hand held assist Transfers: Sit to/from Stand Sit to Stand: Max assist;+2 physical assistance         General transfer comment: Pt very slow to respond and requires multi modal cues to initiate sit to stand.  Max A +2 to power up into standing.  Rt knee buckles     Balance Overall balance assessment: Needs assistance Sitting-balance support: Feet supported Sitting balance-Leahy Scale: Fair       Standing balance-Leahy Scale: Poor                               ADL Overall ADL's : Needs assistance/impaired Eating/Feeding: Total assistance;Bed level   Grooming: Wash/dry hands;Wash/dry face;Oral care;Brushing hair;Total assistance;Bed level   Upper Body Bathing: Total assistance;Bed level   Lower Body Bathing: Total assistance;Bed level   Upper Body Dressing : Total assistance;Bed level   Lower Body Dressing: Total assistance;Bed level   Toilet Transfer: Maximal assistance;+2 for physical assistance Toilet Transfer Details (indicate cue type and reason): sit to stand only  Toileting- Clothing Manipulation and Hygiene: Total assistance;Sit to/from stand;Bed level Toileting - Clothing Manipulation Details (indicate cue type and reason): Pt incontinent of urine and stool.  assisted with peri care      Functional mobility during ADLs: Maximal assistance;+2 for physical assistance General ADL Comments: Pt unable to maintain attention to engage in ADL tasks - becomes distractable and unable to initiate of complete task      Vision     Perception     Praxis Praxis Praxis tested?: Deficits Deficits: Initiation    Pertinent Vitals/Pain Pain Assessment: Faces Faces Pain Scale: No hurt     Hand Dominance  (unsure)   Extremity/Trunk Assessment Upper Extremity Assessment Upper Extremity Assessment: LUE deficits/detail;Generalized weakness LUE Deficits / Details: Pt with limited shoulder ROM to ~70*.  Pt with wasting of instrincs with clawing of digits, appears to have arthritic deformity of digits  LUE Coordination: decreased fine motor;decreased gross motor   Lower Extremity  Assessment Lower Extremity Assessment: Defer to PT evaluation       Communication Communication Communication: No difficulties   Cognition Arousal/Alertness: Lethargic;Awake/alert Behavior During Therapy: WFL for tasks assessed/performed Overall Cognitive Status: Impaired/Different from baseline Area of Impairment:  Orientation;Attention;Memory;Following commands;Safety/judgement;Awareness;Problem solving Orientation Level: Disoriented to;Place;Time;Situation Current Attention Level: Focused Memory: Decreased short-term memory Following Commands: Follows one step commands inconsistently Safety/Judgement: Decreased awareness of safety;Decreased awareness of deficits   Problem Solving: Slow processing;Decreased initiation;Difficulty sequencing;Requires verbal cues;Requires tactile cues General Comments: Pt quite confused.  No family present to determine pt baseline    General Comments       Exercises       Shoulder Instructions      Home Living Family/patient expects to be discharged to:: Unsure                                 Additional Comments: Per nursing, pt resided with his wife.  No family present during eval to determine home environment or PLOF      Prior Functioning/Environment Level of Independence: Needs assistance        Comments: Pt unable to provide info and no family present     OT Diagnosis: Generalized weakness;Cognitive deficits   OT Problem List: Decreased strength;Decreased activity tolerance;Impaired balance (sitting and/or standing);Decreased safety awareness;Decreased knowledge of use of DME or AE;Impaired UE functional use   OT Treatment/Interventions: Self-care/ADL training;DME and/or AE instruction;Therapeutic activities;Cognitive remediation/compensation;Patient/family education;Balance training    OT Goals(Current goals can be found in the care plan section) Acute Rehab OT Goals OT Goal Formulation: Patient unable to participate in goal setting Time For Goal Achievement: 05/17/15 Potential to Achieve Goals: Good ADL Goals Pt Will Perform Grooming: with min assist;sitting Pt Will Perform Upper Body Bathing: with mod assist;sitting Pt Will Transfer to Toilet: with mod assist;stand pivot transfer;bedside commode  OT Frequency: Min 2X/week    Barriers to D/C:            Co-evaluation              End of Session Nurse Communication: Mobility status  Activity Tolerance: Patient tolerated treatment well Patient left: in bed;with call bell/phone within reach;with bed alarm set;with nursing/sitter in room   Time: [id number] OT Time Calculation (min): 23 min Charges:  OT General Charges $OT Visit: 1 Procedure OT Evaluation $Initial OT Evaluation Tier I: 1 Procedure OT Treatments $Self Care/Home Management : 8-22 mins G-Codes:    Sophiagrace Benbrook M May 07, 2015, 1:16 PM

## 2015-05-03 NOTE — Progress Notes (Signed)
Received a new order for a swallow evaluation. Evaluation complete 11/10 and indicated no need for f/u. Discussed with RN who reported that a repeat evaluation was not needed.  [place], [place] [phone]

## 2015-05-04 ENCOUNTER — Inpatient Hospital Stay (HOSPITAL_COMMUNITY): Payer: Medicare Other

## 2015-05-04 MED ORDER — PNEUMOCOCCAL VAC POLYVALENT 25 MCG/0.5ML IJ INJ
0.5000 mL | INJECTION | INTRAMUSCULAR | Status: AC
  Administered 2015-05-06: 0.5 mL via INTRAMUSCULAR
  Filled 2015-05-04: qty 0.5

## 2015-05-04 MED ORDER — INFLUENZA VAC SPLIT QUAD 0.5 ML IM SUSY
0.5000 mL | PREFILLED_SYRINGE | INTRAMUSCULAR | Status: AC
Start: 2015-05-05 — End: 2015-05-06
  Administered 2015-05-06: 0.5 mL via INTRAMUSCULAR
  Filled 2015-05-04: qty 0.5

## 2015-05-04 NOTE — Progress Notes (Signed)
Patient Demographics  Stephen English, is a 81 y.o. male, DOB - 09-Aug-1924, [id number]  Admit date - 05/02/2015   Admitting Physician Waldemar Dickens, MD  Outpatient Primary MD for the patient is English, Stephen Graff, MD  LOS - 2   Chief Complaint  Patient presents with  . Weakness         Subjective:   [facility] today has, No headache, No chest pain, No abdominal pain  Assessment & Plan    Principal Problem:   CAP (community acquired pneumonia) Active Problems:   Prostate cancer ([place])   Bone metastasis ([place])   Acute encephalopathy   Physical deconditioning   Hypothyroid   Depression   Hypoxia  Community-acquired pneumonia - Continue patient on Rocephin and azithromycin - Follow on blood cultures: No growth to date  Acute encephalopathy - This is most likely metabolic secondary to community-acquired pneumonia - CT head with no acute findings, will check MRI brain without contrast for further evaluation - Patient with overall weakness, no focal deficits.  Generalized weakness/deconditioning - PT/OT - Will need SNF placement  History of prostate cancer - Continue with Xtandi  Depression - Continue with Celexa  Hypothyroidism - Continue with Synthroid  Bradycardia - Not on any AV blocking agents, continue to monitor on telemetry.  Code Status: Full  Family Communication: None at bedside  Disposition Plan: Pending PT evaluation   Procedures  None   Consults   None   Medications  Scheduled Meds: . azithromycin  500 mg Oral Q24H  . cefTRIAXone (ROCEPHIN)  IV  1 g Intravenous Q24H  . citalopram  10 mg Oral Daily  . enoxaparin (LOVENOX) injection  40 mg Subcutaneous Q24H  . enzalutamide  120 mg Oral Daily  . [START ON 05/05/2015] Influenza vac split quadrivalent PF  0.5 mL Intramuscular Tomorrow-1000  . levothyroxine  100 mcg Oral QAC breakfast  .  [START ON 05/05/2015] pneumococcal 23 valent vaccine  0.5 mL Intramuscular Tomorrow-1000  . sodium chloride  3 mL Intravenous Q12H   Continuous Infusions: . sodium chloride 50 mL/hr at 05/04/15 1025   PRN Meds:.sodium chloride, acetaminophen **OR** acetaminophen, ondansetron **OR** ondansetron (ZOFRAN) IV, sodium chloride  DVT Prophylaxis  Lovenox  Lab Results  Component Value Date   PLT 269 05/03/2015    Antibiotics    Anti-infectives    Start     Dose/Rate Route Frequency Ordered Stop   05/03/15 1000  cefTRIAXone (ROCEPHIN) 1 g in dextrose 5 % 50 mL IVPB     1 g 100 mL/hr over 30 Minutes Intravenous Every 24 hours 05/02/15 1122 05/09/15 0959   05/03/15 1000  azithromycin (ZITHROMAX) tablet 500 mg     500 mg Oral Every 24 hours 05/02/15 1122 05/09/15 0959   05/02/15 1030  cefTRIAXone (ROCEPHIN) 1 g in dextrose 5 % 50 mL IVPB     1 g 100 mL/hr over 30 Minutes Intravenous  Once 05/02/15 1022 05/02/15 1056   05/02/15 1030  azithromycin (ZITHROMAX) 500 mg in dextrose 5 % 250 mL IVPB     500 mg 250 mL/hr over 60 Minutes Intravenous  Once 05/02/15 1022 05/02/15 1215          Objective:   Filed Vitals:   05/03/15  1040 05/03/15 2030 05/04/15 0615 05/04/15 1407  BP:  130/67 171/85 111/65  Pulse:  84 64 57  Temp:  98 F (36.7 C) 98.4 F (36.9 C) 97.5 F (36.4 C)  TempSrc:  Oral Oral Oral  Resp:  18  19  Height: 5\' 9"  (1.753 m)     Weight:      SpO2:  93% 98% 93%    Wt Readings from Last 3 Encounters:  05/02/15 99.6 kg (219 lb 9.3 oz)  11/10/13 99.61 kg (219 lb 9.6 oz)  10/06/13 100.699 kg (222 lb)     Intake/Output Summary (Last 24 hours) at 05/04/15 1458 Last data filed at 05/04/15 1300  Gross per 24 hour  Intake    300 ml  Output      0 ml  Net    300 ml     Physical Exam  Frail, elderly,more communicative today . Supple Neck,No JVD,  Symmetrical Chest wall movement, Good air movement bilaterally, no wheezing ,No Gallops,Rubs or new Murmurs, No  Parasternal Heave +ve B.Sounds, Abd Soft, No tenderness, No organomegaly appriciated, No rebound - guarding or rigidity. No Cyanosis, Clubbing or edema, no focal deficits could be identified on physical exam, has generalized weakness.   Data Review   Micro Results Recent Results (from the past 240 hour(s))  Culture, blood (routine x 2) Call MD if unable to obtain prior to antibiotics being given     Status: None (Preliminary result)   Collection Time: 05/02/15 11:35 AM  Result Value Ref Range Status   Specimen Description BLOOD RIGHT ARM  Final   Special Requests BOTTLES DRAWN AEROBIC AND ANAEROBIC 10CC  Final   Culture NO GROWTH 2 DAYS  Final   Report Status PENDING  Incomplete  Culture, blood (routine x 2) Call MD if unable to obtain prior to antibiotics being given     Status: None (Preliminary result)   Collection Time: 05/02/15 11:55 AM  Result Value Ref Range Status   Specimen Description BLOOD RIGHT ARM  Final   Special Requests BOTTLES DRAWN AEROBIC AND ANAEROBIC 10CC  Final   Culture NO GROWTH 2 DAYS  Final   Report Status PENDING  Incomplete    Radiology Reports Dg Chest 2 View  05/02/2015  CLINICAL DATA:  Progressive weakness. Chest pain and cough. Prostate cancer. EXAM: CHEST - 2 VIEW COMPARISON:  Two-view chest x-ray 12/18/2009 FINDINGS: The heart size is normal. Asymmetric interstitial coarsening is present on the left. A left pleural effusion is suspected. Degenerate changes are noted at the shoulders bilaterally. IMPRESSION: 1. Asymmetric interstitial and airspace disease in the left lung is concerning for pneumonia. Electronically Signed   By: San Morelle M.D.   On: 05/02/2015 09:56   Ct Head Wo Contrast  05/02/2015  CLINICAL DATA:  Generalized weakness for 2 days EXAM: CT HEAD WITHOUT CONTRAST TECHNIQUE: Contiguous axial images were obtained from the base of the skull through the vertex without intravenous contrast. COMPARISON:  None. FINDINGS: The bony  calvarium is intact. Some scattered areas of sclerosis are noted which may be related to the patient's known history of prior metastatic prostate carcinoma. Diffuse atrophic changes are seen. Chronic white matter ischemic changes are noted. No acute hemorrhage, acute infarction or space-occupying mass lesion is identified. IMPRESSION: Chronic atrophic and ischemic changes. No acute intracranial abnormality is noted. Sclerotic foci within the skullbase which may be related to the previous history of metastatic prostate carcinoma. Electronically Signed   By: Linus Mako.D.  On: 05/02/2015 10:11     CBC  Recent Labs Lab 05/02/15 0901 05/03/15 0324  WBC 8.7 6.8  HGB 12.5* 11.0*  HCT 38.4* 33.7*  PLT 272 269  MCV 93.0 92.8  MCH 30.3 30.3  MCHC 32.6 32.6  RDW 14.1 14.2  LYMPHSABS 1.7  --   MONOABS 0.7  --   EOSABS 0.4  --   BASOSABS 0.1  --     Chemistries   Recent Labs Lab 05/02/15 0901 05/03/15 0324  NA 138 140  K 4.1 3.8  CL 101 104  CO2 27 29  GLUCOSE 109* 103*  BUN 16 11  CREATININE 0.93 0.80  CALCIUM 9.1 8.7*  AST 19  --   ALT 9*  --   ALKPHOS 50  --   BILITOT 0.6  --    ------------------------------------------------------------------------------------------------------------------ estimated creatinine clearance is 74.3 mL/min (by C-G formula based on Cr of 0.8). ------------------------------------------------------------------------------------------------------------------ No results for input(s): HGBA1C in the last 72 hours. ------------------------------------------------------------------------------------------------------------------ No results for input(s): CHOL, HDL, LDLCALC, TRIG, CHOLHDL, LDLDIRECT in the last 72 hours. ------------------------------------------------------------------------------------------------------------------ No results for input(s): TSH, T4TOTAL, T3FREE, THYROIDAB in the last 72 hours.  Invalid input(s):  FREET3 ------------------------------------------------------------------------------------------------------------------ No results for input(s): VITAMINB12, FOLATE, FERRITIN, TIBC, IRON, RETICCTPCT in the last 72 hours.  Coagulation profile  Recent Labs Lab 05/02/15 0901  INR 1.09    No results for input(s): DDIMER in the last 72 hours.  Cardiac Enzymes No results for input(s): CKMB, TROPONINI, MYOGLOBIN in the last 168 hours.  Invalid input(s): CK ------------------------------------------------------------------------------------------------------------------ Invalid input(s): POCBNP     Time Spent in minutes   30 minutes   Srinika Delone M.D on 05/04/2015 at 2:58 PM  Between 7am to 7pm - Pager - [phone]  After 7pm go to [email or web address] - password [facility]  [facility]   Office  [phone]

## 2015-05-05 NOTE — Progress Notes (Signed)
Put pt. In recliner for 3 hours it took 3 people to move him from the bed to the chair and back

## 2015-05-05 NOTE — Progress Notes (Signed)
Patient Demographics  Stephen English, is a 81 y.o. male, DOB - [birth year], [id number]  Admit date - 05/02/2015   Admitting Physician Waldemar Dickens, MD  Outpatient Primary MD for the patient is English, Stephen Graff, MD  LOS - 3   Chief Complaint  Patient presents with  . Weakness         Subjective:   [facility] today has, No headache, No chest pain, No abdominal pain  Assessment & Plan    Principal Problem:   CAP (community acquired pneumonia) Active Problems:   Prostate cancer ([place])   Bone metastasis ([place])   Acute encephalopathy   Physical deconditioning   Hypothyroid   Depression   Hypoxia  Community-acquired pneumonia - Continue patient on Rocephin and azithromycin - Follow on blood cultures: No growth to date  Acute encephalopathy - Patient has some gradual decline of physical and mental function over last few month , there is certainly some degree of dementia most likely worsened by metabolic encephalopathy secondary to community-acquired pneumonia - CT head with no acute findings, unable to perform MRI of the brain as patient was unable to stay still during study - Patient with overall weakness, no focal deficits.  Generalized weakness/deconditioning - PT/OT - Will need SNF placement  History of prostate cancer - Continue with Xtandi  Depression - Continue with Celexa  Hypothyroidism - Continue with Synthroid  Bradycardia - Not on any AV blocking agents, continue to monitor on telemetry.  Code Status: Full  Family Communication: None at bedside  Disposition Plan:Will need SNF placement  Procedures  None   Consults   None   Medications  Scheduled Meds: . azithromycin  500 mg Oral Q24H  . cefTRIAXone (ROCEPHIN)  IV  1 g Intravenous Q24H  . citalopram  10 mg Oral Daily  . enoxaparin (LOVENOX) injection  40 mg Subcutaneous Q24H  . enzalutamide   120 mg Oral Daily  . Influenza vac split quadrivalent PF  0.5 mL Intramuscular Tomorrow-1000  . levothyroxine  100 mcg Oral QAC breakfast  . pneumococcal 23 valent vaccine  0.5 mL Intramuscular Tomorrow-1000  . sodium chloride  3 mL Intravenous Q12H   Continuous Infusions: . sodium chloride 50 mL/hr at 05/04/15 1025   PRN Meds:.sodium chloride, acetaminophen **OR** acetaminophen, ondansetron **OR** ondansetron (ZOFRAN) IV, sodium chloride  DVT Prophylaxis  Lovenox  Lab Results  Component Value Date   PLT 269 05/03/2015    Antibiotics    Anti-infectives    Start     Dose/Rate Route Frequency Ordered Stop   05/03/15 1000  cefTRIAXone (ROCEPHIN) 1 g in dextrose 5 % 50 mL IVPB     1 g 100 mL/hr over 30 Minutes Intravenous Every 24 hours 05/02/15 1122 05/09/15 0959   05/03/15 1000  azithromycin (ZITHROMAX) tablet 500 mg     500 mg Oral Every 24 hours 05/02/15 1122 05/09/15 0959   05/02/15 1030  cefTRIAXone (ROCEPHIN) 1 g in dextrose 5 % 50 mL IVPB     1 g 100 mL/hr over 30 Minutes Intravenous  Once 05/02/15 1022 05/02/15 1056   05/02/15 1030  azithromycin (ZITHROMAX) 500 mg in dextrose 5 % 250 mL IVPB     500 mg 250 mL/hr over 60 Minutes Intravenous  Once 05/02/15 1022 05/02/15 1215          Objective:   Filed Vitals:   05/04/15 1407 05/04/15 2116 05/05/15 0014 05/05/15 0348  BP: 111/65 165/74 171/85 151/90  Pulse: 57 66  58  Temp: 97.5 F (36.4 C) 97.9 F (36.6 C)  98.6 F (37 C)  TempSrc: Oral Oral  Oral  Resp: 19 18  18   Height:      Weight:      SpO2: 93% 96%  97%    Wt Readings from Last 3 Encounters:  05/02/15 99.6 kg (219 lb 9.3 oz)  11/10/13 99.61 kg (219 lb 9.6 oz)  10/06/13 100.699 kg (222 lb)     Intake/Output Summary (Last 24 hours) at 05/05/15 1138 Last data filed at 05/04/15 2230  Gross per 24 hour  Intake     60 ml  Output      0 ml  Net     60 ml     Physical Exam  Frail, elderly indicative, confused . Supple Neck,No JVD,    Symmetrical Chest wall movement, Good air movement bilaterally, no wheezing ,No Gallops,Rubs or new Murmurs, No Parasternal Heave +ve B.Sounds, Abd Soft, No tenderness, No organomegaly appriciated, No rebound - guarding or rigidity. No Cyanosis, Clubbing or edema, no focal deficits could be identified on physical exam, has generalized weakness.   Data Review   Micro Results Recent Results (from the past 240 hour(s))  Culture, blood (routine x 2) Call MD if unable to obtain prior to antibiotics being given     Status: None (Preliminary result)   Collection Time: 05/02/15 11:35 AM  Result Value Ref Range Status   Specimen Description BLOOD RIGHT ARM  Final   Special Requests BOTTLES DRAWN AEROBIC AND ANAEROBIC 10CC  Final   Culture NO GROWTH 2 DAYS  Final   Report Status PENDING  Incomplete  Culture, blood (routine x 2) Call MD if unable to obtain prior to antibiotics being given     Status: None (Preliminary result)   Collection Time: 05/02/15 11:55 AM  Result Value Ref Range Status   Specimen Description BLOOD RIGHT ARM  Final   Special Requests BOTTLES DRAWN AEROBIC AND ANAEROBIC 10CC  Final   Culture NO GROWTH 2 DAYS  Final   Report Status PENDING  Incomplete    Radiology Reports Dg Chest 2 View  05/02/2015  CLINICAL DATA:  Progressive weakness. Chest pain and cough. Prostate cancer. EXAM: CHEST - 2 VIEW COMPARISON:  Two-view chest x-ray 12/18/2009 FINDINGS: The heart size is normal. Asymmetric interstitial coarsening is present on the left. A left pleural effusion is suspected. Degenerate changes are noted at the shoulders bilaterally. IMPRESSION: 1. Asymmetric interstitial and airspace disease in the left lung is concerning for pneumonia. Electronically Signed   By: San Morelle M.D.   On: 05/02/2015 09:56   Ct Head Wo Contrast  05/02/2015  CLINICAL DATA:  Generalized weakness for 2 days EXAM: CT HEAD WITHOUT CONTRAST TECHNIQUE: Contiguous axial images were obtained  from the base of the skull through the vertex without intravenous contrast. COMPARISON:  None. FINDINGS: The bony calvarium is intact. Some scattered areas of sclerosis are noted which may be related to the patient's known history of prior metastatic prostate carcinoma. Diffuse atrophic changes are seen. Chronic white matter ischemic changes are noted. No acute hemorrhage, acute infarction or space-occupying mass lesion is identified. IMPRESSION: Chronic atrophic and ischemic changes. No acute intracranial abnormality is noted. Sclerotic foci within the skullbase  which may be related to the previous history of metastatic prostate carcinoma. Electronically Signed   By: Inez Catalina M.D.   On: 05/02/2015 10:11   Mr Brain Wo Contrast  05/04/2015  CLINICAL DATA:  Altered mental status.  Increasing weakness. EXAM: MRI HEAD WITHOUT CONTRAST TECHNIQUE: Multiplanar, multiecho pulse sequences of the brain and surrounding structures were obtained without intravenous contrast. COMPARISON:  CT head 05/02/2015. FINDINGS: The patient was unable to remain motionless for the exam. Small or subtle lesions could be overlooked. Furthermore, due to lack of patient cooperation, the exam was truncated prematurely. No restricted diffusion is definitely identified. There is advanced atrophy with hydrocephalus ex vacuo. Extensive white matter disease is suspected. There is no definite midline shift, mass lesion, or visible hemorrhage. When correlated with recent CT, a similar appearance is noted. IMPRESSION: Marginally diagnostic exam due to patient motion and premature truncation due to lack of cooperation. No visible acute infarction, intracranial mass lesion, or midline shift. Electronically Signed   By: Staci Righter M.D.   On: 05/04/2015 15:30     CBC  Recent Labs Lab 05/02/15 0901 05/03/15 0324  WBC 8.7 6.8  HGB 12.5* 11.0*  HCT 38.4* 33.7*  PLT 272 269  MCV 93.0 92.8  MCH 30.3 30.3  MCHC 32.6 32.6  RDW 14.1 14.2   LYMPHSABS 1.7  --   MONOABS 0.7  --   EOSABS 0.4  --   BASOSABS 0.1  --     Chemistries   Recent Labs Lab 05/02/15 0901 05/03/15 0324  NA 138 140  K 4.1 3.8  CL 101 104  CO2 27 29  GLUCOSE 109* 103*  BUN 16 11  CREATININE 0.93 0.80  CALCIUM 9.1 8.7*  AST 19  --   ALT 9*  --   ALKPHOS 50  --   BILITOT 0.6  --    ------------------------------------------------------------------------------------------------------------------ estimated creatinine clearance is 74.3 mL/min (by C-G formula based on Cr of 0.8). ------------------------------------------------------------------------------------------------------------------ No results for input(s): HGBA1C in the last 72 hours. ------------------------------------------------------------------------------------------------------------------ No results for input(s): CHOL, HDL, LDLCALC, TRIG, CHOLHDL, LDLDIRECT in the last 72 hours. ------------------------------------------------------------------------------------------------------------------ No results for input(s): TSH, T4TOTAL, T3FREE, THYROIDAB in the last 72 hours.  Invalid input(s): FREET3 ------------------------------------------------------------------------------------------------------------------ No results for input(s): VITAMINB12, FOLATE, FERRITIN, TIBC, IRON, RETICCTPCT in the last 72 hours.  Coagulation profile  Recent Labs Lab 05/02/15 0901  INR 1.09    No results for input(s): DDIMER in the last 72 hours.  Cardiac Enzymes No results for input(s): CKMB, TROPONINI, MYOGLOBIN in the last 168 hours.  Invalid input(s): CK ------------------------------------------------------------------------------------------------------------------ Invalid input(s): POCBNP     Time Spent in minutes   30 minutes   Carston Riedl M.D on 05/05/2015 at 11:38 AM  Between 7am to 7pm - Pager - [phone]  After 7pm go to [email or web address] - password [facility]  [facility]   Office  [phone]

## 2015-05-05 NOTE — Clinical Social Work Placement (Signed)
   CLINICAL SOCIAL WORK PLACEMENT  NOTE  Date:  05/05/2015  Patient Details  Name: Stephen English MRN: [id number] Date of Birth: 14-Jan-1925  Clinical Social Work is seeking post-discharge placement for this patient at the [place] level of care (*CSW will initial, date and re-position this form in  chart as items are completed):  Yes   Patient/family provided with [place] Work Department's list of facilities offering this level of care within the geographic area requested by the patient (or if unable, by the patient's family).  Yes   Patient/family informed of their freedom to choose among providers that offer the needed level of care, that participate in Medicare, Medicaid or managed care program needed by the patient, have an available bed and are willing to accept the patient.  Yes   Patient/family informed of [facility]'s ownership interest in [facility] and [facility], as well as of the fact that they are under no obligation to receive care at these facilities.  PASRR submitted to EDS on 05/05/15     PASRR number received on 05/05/15     Existing PASRR number confirmed on       FL2 transmitted to all facilities in geographic area requested by pt/family on       FL2 transmitted to all facilities within larger geographic area on       Patient informed that his/her managed care company has contracts with or will negotiate with certain facilities, including the following:            Patient/family informed of bed offers received.  Patient chooses bed at       Physician recommends and patient chooses bed at      Patient to be transferred to   on  .  Patient to be transferred to facility by       Patient family notified on   of transfer.  Name of family member notified:        PHYSICIAN Please sign FL2     Additional Comment:    _______________________________________________ Lilly Cove, LCSW 05/05/2015, 3:37  PM

## 2015-05-05 NOTE — Clinical Social Work Note (Signed)
Clinical Social Work Assessment  Patient Details  Name: Stephen English MRN: [id number] Date of Birth: Oct 31, 1924  Date of referral:  05/05/15               Reason for consult:  Facility Placement                Permission sought to share information with:  Case Manager, Customer service manager, Family Supports Permission granted to share information::  Yes, Verbal Permission Granted  Name::        Agency::  Wife and sister in law at the bedside along with neighbors (strong family/friend support)  Relationship::     Contact Information:     Housing/Transportation Living arrangements for the past 2 months:  Single Family Home Source of Information:  Spouse, Medical Team, Engineer, materials, Other (Comment Required) (sister in Sports coach) Patient Interpreter Needed:  None Criminal Activity/Legal Involvement Pertinent to Current Situation/Hospitalization:  No - Comment as needed Significant Relationships:  Spouse, Friend, Other Family Members Lives with:  Spouse Do you feel safe going back to the place where you live?  No (wife does not feel she can handle him at home currently ) Need for family participation in patient care:  Yes (Comment) (wife and other family members involved)  Care giving concerns:  No care giving concerns at this time.  Wife reports she will not be able to take care of him in his current state as with her age and own health, it would be too much to manage. Wife's sister involved and assisting with care and information regarding SNF.    Social Worker assessment / plan:  LCSW covering the weekend met with patient and family at the bedside and devised disposition plan. All are in favor of SNF as this will be the safest plan for patient.  Family request [facility] or [facility] due to location. LCSW completed all necessary pieces for placement and will have week day SW follow up with bed offers and complete SNF transfer.    Employment status:  Retired Designer, industrial/product PT Recommendations:  [place] / Referral to community resources:  [place]  Patient/Family's Response to care:  Agreeable to plan  Patient/Family's Understanding of and Emotional Response to Diagnosis, Current Treatment, and Prognosis:  Wife is tearful that patient must go to SNF, but understands and is realistic in what she can handle and what will keep patient safe.   Emotional Assessment Appearance:  Appears stated age Attitude/Demeanor/Rapport:  Other (calm and cooperative, interacting with friends visiting) Affect (typically observed):  Accepting, Calm Orientation:  Oriented to Self, Oriented to Place Alcohol / Substance use:  Never Used Psych involvement (Current and /or in the community):  No (Comment)  Discharge Needs  Concerns to be addressed:  Basic Needs, Care Coordination Readmission within the last 30 days:  No Current discharge risk:  None Barriers to Discharge:  Ship broker, Continued Medical Work up   Lilly Cove, LCSW 05/05/2015, 3:12 PM

## 2015-05-05 NOTE — NC FL2 (Signed)
[place] MEDICAID FL2 LEVEL OF CARE SCREENING TOOL     IDENTIFICATION  Patient Name: Stephen English Birthdate: 01-19-1925 Sex: male Admission Date (Current Location): 05/02/2015  [facility] and [place] Number:     Facility and Address:  [facility] [address], [place], [place] [address]      Provider Number: [id number]  Attending Physician Name and Address:  Albertine Patricia, MD  Relative Name and Phone Number:       Current Level of Care: Hospital Recommended Level of Care: [place] Prior Approval Number:    Date Approved/Denied:   PASRR Number:   [id number]   Discharge Plan: SNF    Current Diagnoses: Patient Active Problem List   Diagnosis Date Noted  . CAP (community acquired pneumonia) 05/02/2015  . Acute encephalopathy 05/02/2015  . Physical deconditioning 05/02/2015  . Hypothyroid 05/02/2015  . Depression 05/02/2015  . Hypoxia 05/02/2015  . Prostate cancer ([place]) 08/25/2013  . Bone metastasis ([place]) 08/25/2013    Orientation ACTIVITIES/SOCIAL BLADDER RESPIRATION    Self, Situation, Place  Active, Family supportive Incontinent O2 (As needed) (2L)  BEHAVIORAL SYMPTOMS/MOOD NEUROLOGICAL BOWEL NUTRITION STATUS      Continent Diet  PHYSICIAN VISITS COMMUNICATION OF NEEDS Height & Weight Skin    Verbally 5\' 9"  (175.3 cm) 219 lbs. Normal          AMBULATORY STATUS RESPIRATION    Assist extensive O2 (As needed) (2L)      Personal Care Assistance Level of Assistance  Bathing, Feeding, Dressing Bathing Assistance: Limited assistance Feeding assistance: Limited assistance Dressing Assistance: Limited assistance      Functional Limitations Info  Sight, Hearing Sight Info: Impaired Hearing Info: Impaired         SPECIAL CARE FACTORS FREQUENCY  PT (By licensed PT), OT (By licensed OT), Speech therapy     PT Frequency: 3x a week OT Frequency: 3x a week     Speech Therapy Frequency: 3x a week      Additional Factors Info  Code Status, Allergies, Psychotropic Code Status Info: Full Code Allergies Info: NKA Psychotropic Info: Celexa for Depression         Current Medications (05/05/2015): Current Facility-Administered Medications  Medication Dose Route Frequency Provider Last Rate Last Dose  . 0.9 %  sodium chloride infusion  250 mL Intravenous PRN Waldemar Dickens, MD      . 0.9 %  sodium chloride infusion   Intravenous Continuous Albertine Patricia, MD 50 mL/hr at 05/04/15 1025    . acetaminophen (TYLENOL) tablet 650 mg  650 mg Oral Q6H PRN Waldemar Dickens, MD       Or  . acetaminophen (TYLENOL) suppository 650 mg  650 mg Rectal Q6H PRN Waldemar Dickens, MD      . azithromycin [facility]) tablet 500 mg  500 mg Oral Q24H Waldemar Dickens, MD   500 mg at 05/05/15 1000  . cefTRIAXone (ROCEPHIN) 1 g in dextrose 5 % 50 mL IVPB  1 g Intravenous Q24H Waldemar Dickens, MD   1 g at 05/05/15 1100  . citalopram (CELEXA) tablet 10 mg  10 mg Oral Daily Waldemar Dickens, MD   10 mg at 05/05/15 1100  . enoxaparin (LOVENOX) injection 40 mg  40 mg Subcutaneous Q24H Waldemar Dickens, MD   40 mg at 05/05/15 1150  . enzalutamide (XTANDI) capsule 120 mg  120 mg Oral Daily Waldemar Dickens, MD   120 mg at  05/05/15 1100  . Influenza vac split quadrivalent PF (FLUARIX) injection 0.5 mL  0.5 mL Intramuscular Tomorrow-1000 Silver Huguenin Elgergawy, MD      . levothyroxine (SYNTHROID, LEVOTHROID) tablet 100 mcg  100 mcg Oral QAC breakfast Waldemar Dickens, MD   100 mcg at 05/04/15 [id number]  . ondansetron (ZOFRAN) tablet 4 mg  4 mg Oral Q6H PRN Waldemar Dickens, MD       Or  . ondansetron [facility]) injection 4 mg  4 mg Intravenous Q6H PRN Waldemar Dickens, MD      . pneumococcal 23 valent vaccine (PNU-IMMUNE) injection 0.5 mL  0.5 mL Intramuscular Tomorrow-1000 Dawood S Elgergawy, MD      . sodium chloride 0.9 % injection 3 mL  3 mL Intravenous Q12H Waldemar Dickens, MD   3 mL at 05/05/15 1000  . sodium chloride 0.9 %  injection 3 mL  3 mL Intravenous PRN Waldemar Dickens, MD       Do not use this list as official medication orders. Please verify with discharge summary.  Discharge Medications:   Medication List    ASK your doctor about these medications        CALCIUM + D PO  Take by mouth daily.     citalopram 10 MG tablet  Commonly known as:  CELEXA     enzalutamide 40 MG capsule  Commonly known as:  XTANDI  Take 3 capsules (120 mg total) by mouth daily.     levothyroxine 100 MCG tablet  Commonly known as:  SYNTHROID, LEVOTHROID  Take 100 mcg by mouth daily.     naproxen sodium 220 MG tablet  Commonly known as:  ANAPROX  Take 220 mg by mouth every 8 (eight) hours as needed (pain).     VITAMIN B 12 PO  Take 1 tablet by mouth.        Relevant Imaging Results:  Relevant Lab Results:  Recent Labs    Additional Information    Lilly Cove, LCSW

## 2015-05-06 DIAGNOSIS — J189 Pneumonia, unspecified organism: Secondary | ICD-10-CM | POA: Diagnosis not present

## 2015-05-06 LAB — URINALYSIS, ROUTINE W REFLEX MICROSCOPIC
GLUCOSE, UA: NEGATIVE mg/dL
HGB URINE DIPSTICK: NEGATIVE
Leukocytes, UA: NEGATIVE
Nitrite: NEGATIVE
PROTEIN: NEGATIVE mg/dL
Specific Gravity, Urine: 1.025 (ref 1.005–1.030)
UROBILINOGEN UA: 0.2 mg/dL (ref 0.0–1.0)
pH: 5 (ref 5.0–8.0)

## 2015-05-06 LAB — RAPID URINE DRUG SCREEN, HOSP PERFORMED
AMPHETAMINES: NOT DETECTED
BARBITURATES: NOT DETECTED
BENZODIAZEPINES: NOT DETECTED
Cocaine: NOT DETECTED
Opiates: NOT DETECTED
Tetrahydrocannabinol: NOT DETECTED

## 2015-05-06 LAB — STREP PNEUMONIAE URINARY ANTIGEN: Strep Pneumo Urinary Antigen: NEGATIVE

## 2015-05-06 MED ORDER — ENSURE ENLIVE PO LIQD: 237.0000 mL | Freq: Two times a day (BID) | ORAL | Status: AC

## 2015-05-06 MED ORDER — ACETAMINOPHEN 325 MG PO TABS: 325.0000 mg | ORAL_TABLET | ORAL | Status: AC | PRN

## 2015-05-06 MED ORDER — ENSURE ENLIVE PO LIQD
237.0000 mL | Freq: Two times a day (BID) | ORAL | Status: DC
  Administered 2015-05-06 (×2): 237 mL via ORAL

## 2015-05-06 MED ORDER — LEVOFLOXACIN 500 MG PO TABS: 500.0000 mg | ORAL_TABLET | Freq: Every day | ORAL | Status: AC

## 2015-05-06 MED ORDER — ONDANSETRON HCL 4 MG PO TABS: 4.0000 mg | ORAL_TABLET | Freq: Four times a day (QID) | ORAL | Status: AC | PRN

## 2015-05-06 NOTE — Progress Notes (Signed)
Patient transported via ems to SNF. Stephen English, Bettina Gavia RN

## 2015-05-06 NOTE — Clinical Social Work Note (Addendum)
12:38pm- CSW spoke with [facility] liaison who states she will reach out to patient's spouse and son to determine who will be signing admission's paperwork.  CSW called and left son a message (on cell) regarding patient's discharge today and details of such.  CSW requested a return call from patient's son.  CSW awaiting a return call.  12:00pm- CSW insurance requires PT evaluation within 48 hours of authorization.  PT's last evaluation was 05/02/2015.  PT aware of the request and order for discharge.  CSW awaiting authorization for an afternoon discharge. MD and RNCM aware.  CSW has presented wife with bed offers.  Wife is agreeable to SNF at discharge.  At this time, [facility] and [facility] are the only two bed offers for patient.  Wife is aware and has chosen [facility] who is able to offer a semi-private room at this time.  Wife is agreeable.  Patient will need PTAR transportation at time of discharge.  Nonnie Done, LCSW [phone]  [facility] Licensed Clinical Social Worker

## 2015-05-06 NOTE — Progress Notes (Signed)
Physical Therapy Treatment Patient Details Name: Stephen English MRN: [id number] DOB: [birth year] Today's Date: 05/06/2015    History of Present Illness 81 y.o. male admitted with community acquired pneumonia, acute encephalopathy, and generalized weakness.    PT Comments    Patient progressing slowly towards PT goals. Continues to be confused with difficulty following simple commands consistently. Requires assist of 2 for standing and mobility. Able to stand with support for 1-2 minutes to perform pericare. Continues to be appropriate for ST SNF. Will follow acutely.   Follow Up Recommendations  SNF;Supervision/Assistance - 24 hour     Equipment Recommendations       Recommendations for Other Services       Precautions / Restrictions Precautions Precautions: Fall Restrictions Weight Bearing Restrictions: No    Mobility  Bed Mobility Overal bed mobility: Needs Assistance Bed Mobility: Supine to Sit;Sit to Supine     Supine to sit: Max assist Sit to supine: Max assist;+2 for physical assistance   General bed mobility comments: Requires cues and assist to initiate movement.  Assist to move LEs off EOB and assist to lift shoulder.    Transfers Overall transfer level: Needs assistance Equipment used: Rolling walker (2 wheeled) Transfers: Sit to/from Stand Sit to Stand: Max assist;+2 physical assistance         General transfer comment: Pt very slow to respond and requires multi modal cues to initiate sit to stand.  Max A +2 to power up into standing. No knee buckling upon ascent, Bil knees buckling upon descent onto bed. Performed x2.  Ambulation/Gait                 Stairs            Wheelchair Mobility    Modified Rankin (Stroke Patients Only)       Balance Overall balance assessment: Needs assistance Sitting-balance support: Feet supported;Bilateral upper extremity supported Sitting balance-Leahy Scale: Fair     Standing balance  support: During functional activity Standing balance-Leahy Scale: Zero Standing balance comment: Requires Mod A to maintain standing balance with UE support. Bil knees buckle upon descent onto bed x2. Stood for ~1-2 minutes for pericare.                    Cognition Arousal/Alertness: Awake/alert Behavior During Therapy: WFL for tasks assessed/performed Overall Cognitive Status: No family/caregiver present to determine baseline cognitive functioning Area of Impairment: Following commands;Safety/judgement       Following Commands: Follows one step commands inconsistently Safety/Judgement: Decreased awareness of safety;Decreased awareness of deficits   Problem Solving: Slow processing;Decreased initiation;Difficulty sequencing;Requires verbal cues;Requires tactile cues General Comments: Pt confused.    Exercises      General Comments General comments (skin integrity, edema, etc.): wife and friend present during session.      Pertinent Vitals/Pain Pain Assessment: No/denies pain Faces Pain Scale: No hurt    Home Living                      Prior Function            PT Goals (current goals can now be found in the care plan section) Progress towards PT goals: Progressing toward goals    Frequency  Min 2X/week    PT Plan Current plan remains appropriate    Co-evaluation             End of Session Equipment Utilized During Treatment: Gait belt Activity Tolerance: Patient limited by fatigue  Patient left: in bed;with call bell/phone within reach;with bed alarm set;with family/visitor present     Time: [id number] PT Time Calculation (min) (ACUTE ONLY): 26 min  Charges:  $Therapeutic Activity: 23-37 mins                    G Codes:      Stephen English A Natausha Jungwirth 05/06/2015, 2:17 PM Wray Kearns, [place], DPT [phone]

## 2015-05-06 NOTE — Discharge Summary (Signed)
Stephen English, is a 81 y.o. male  DOB 1924-11-25  MRN [id number].  Admission date:  05/02/2015  Admitting Physician  Waldemar Dickens, MD  Discharge Date:  05/06/2015   Primary MD  Abigail Miyamoto, MD  Recommendations for primary care physician for things to follow:  - please check CBC, BMP in 3 days., Repeat 2 view chest x-ray in 2 weeks.   Admission Diagnosis  CAP (community acquired pneumonia) [J18.9]   Discharge Diagnosis  CAP (community acquired pneumonia) [J18.9]    Principal Problem:   CAP (community acquired pneumonia) Active Problems:   Prostate cancer ([place])   Bone metastasis ([place])   Acute encephalopathy   Physical deconditioning   Hypothyroid   Depression   Hypoxia      Past Medical History  Diagnosis Date  . Broken ankle 2001  . Broken leg 1999  . Ventral hernia   . Cancer [facility])     prostate  . Prostate cancer [facility])     Past Surgical History  Procedure Laterality Date  . Appendectomy  1948  . Hernia repair  03/14/10    [facility]  . Prostate surgery  12/2009       History of present illness and  Hospital Course:     Kindly see H&P for history of present illness and admission details, please review complete Labs, Consult reports and Test reports for all details in brief  HPI  from the history and physical done on the day of admission 05/02/2015 Stephen English is a 81 y.o. male  Level V caveat: Patient presenting in altered mental state and only able to give limited information with regards to his current medical condition. History provided by family members with patient as well as ED physician.  Patient lives at home with his wife. Over the last several days patient is becoming increasingly weak. Family is noted that patient appears to be leaning more to one side during ambulation. This morning family were unable to get patient out of bed because of his weakness.  Nothing makes his symptoms better. Nothing makes his symptoms worse. Symptoms are getting worse. Family also states patient has had gradual decline in his mental capacities of the last several days. Tolerating oral intake. Patient is without any complaints of chest pain, shortness of breath, focal abnormality, dysuria, back pain, LOC, fall. Our department was called to evaluate patient was felt that ED evaluation was needed.   Hospital Course  Community-acquired pneumonia -Chest x-ray showing left lung pneumonia, treated with Rocephin and azithromycin during hospital stay, and is out of 2 days of oral levofloxacin as an outpatient, blood cultures remains with no growth to date.   Acute encephalopathy - Patient has some gradual decline of physical and mental function over last few month , there is certainly some degree of dementia most likely worsened by metabolic encephalopathy secondary to community-acquired pneumonia - CT head with no acute findings,  - MRI brain with no evidence of stroke or mass - Patient with overall weakness,  no focal deficits. - Significantly improved.  Generalized weakness/deconditioning - PT/OT - for SNF placement   History of prostate cancer - Continue with Xtandi  Depression - Continue with Celexa  Hypothyroidism - Continue with Synthroid  Bradycardia - Not on any AV blocking agents, no evidence of heart block , asymptomatic .  Discharge Condition:  stable   Follow UP  Follow-up Information    Follow up with FRIED, [name], MD.   Specialty:  Family Medicine   Why:  after discharge from SNF   Contact information:   [address] [place] [address] [phone]         Discharge Instructions  and  Discharge Medications     Discharge Instructions    Diet - low sodium heart healthy    Complete by:  As directed      Discharge instructions    Complete by:  As directed   Follow with Primary MD FRIED, [name], MD after  dischrge from SNF  Get CBC, CMP, 2 view Chest X ray checked  by Primary MD next visit.    Activity: As tolerated with Full fall precautions use walker/cane & assistance as needed   Disposition SNF   Diet: Heart Healthy  , with feeding assistance and aspiration precautions.  For Heart failure patients - Check your Weight same time everyday, if you gain over 2 pounds, or you develop in leg swelling, experience more shortness of breath or chest pain, call your Primary MD immediately. Follow Cardiac Low Salt Diet and 1.5 lit/day fluid restriction.   On your next visit with your primary care physician please Get Medicines reviewed and adjusted.   Please request your Prim.MD to go over all Hospital Tests and Procedure/Radiological results at the follow up, please get all Hospital records sent to your Prim MD by signing hospital release before you go home.   If you experience worsening of your admission symptoms, develop shortness of breath, life threatening emergency, suicidal or homicidal thoughts you must seek medical attention immediately by calling 911 or calling your MD immediately  if symptoms less severe.  You Must read complete instructions/literature along with all the possible adverse reactions/side effects for all the Medicines you take and that have been prescribed to you. Take any new Medicines after you have completely understood and accpet all the possible adverse reactions/side effects.   Do not drive, operating heavy machinery, perform activities at heights, swimming or participation in water activities or provide baby sitting services if your were admitted for syncope or siezures until you have seen by Primary MD or a Neurologist and advised to do so again.  Do not drive when taking Pain medications.    Do not take more than prescribed Pain, Sleep and Anxiety Medications  Special Instructions: If you have smoked or chewed Tobacco  in the last 2 yrs please stop smoking,  stop any regular Alcohol  and or any Recreational drug use.  Wear Seat belts while driving.   Please note  You were cared for by a hospitalist during your hospital stay. If you have any questions about your discharge medications or the care you received while you were in the hospital after you are discharged, you can call the unit and asked to speak with the hospitalist on call if the hospitalist that took care of you is not available. Once you are discharged, your primary care physician will handle any further medical issues. Please note that NO REFILLS for any  discharge medications will be authorized once you are discharged, as it is imperative that you return to your primary care physician (or establish a relationship with a primary care physician if you do not have one) for your aftercare needs so that they can reassess your need for medications and monitor your lab values.     Increase activity slowly    Complete by:  As directed             Medication List    STOP taking these medications        naproxen sodium 220 MG tablet  Commonly known as:  ANAPROX      TAKE these medications        acetaminophen 325 MG tablet  Commonly known as:  TYLENOL  Take 1 tablet (325 mg total) by mouth every 4 (four) hours as needed for mild pain or fever (or Fever >/= 101).     CALCIUM + D PO  Take by mouth daily.     citalopram 10 MG tablet  Commonly known as:  CELEXA     enzalutamide 40 MG capsule  Commonly known as:  XTANDI  Take 3 capsules (120 mg total) by mouth daily.     feeding supplement (ENSURE ENLIVE) Liqd  Take 237 mLs by mouth 2 (two) times daily between meals.     levofloxacin 500 MG tablet  Commonly known as:  LEVAQUIN  Take 1 tablet (500 mg total) by mouth daily. Please take foe 2 days  then stop.  Start taking on:  05/07/2015     levothyroxine 100 MCG tablet  Commonly known as:  SYNTHROID, LEVOTHROID  Take 100 mcg by mouth daily.     ondansetron 4 MG tablet    Commonly known as:  ZOFRAN  Take 1 tablet (4 mg total) by mouth every 6 (six) hours as needed for nausea.     VITAMIN B 12 PO  Take 1 tablet by mouth.          Diet and Activity recommendation: See Discharge Instructions above   Consults obtained -  None   Major procedures and Radiology Reports - PLEASE review detailed and final reports for all details, in brief -      Dg Chest 2 View  05/02/2015  CLINICAL DATA:  Progressive weakness. Chest pain and cough. Prostate cancer. EXAM: CHEST - 2 VIEW COMPARISON:  Two-view chest x-ray 12/18/2009 FINDINGS: The heart size is normal. Asymmetric interstitial coarsening is present on the left. A left pleural effusion is suspected. Degenerate changes are noted at the shoulders bilaterally. IMPRESSION: 1. Asymmetric interstitial and airspace disease in the left lung is concerning for pneumonia. Electronically Signed   By: San Morelle M.D.   On: 05/02/2015 09:56   Ct Head Wo Contrast  05/02/2015  CLINICAL DATA:  Generalized weakness for 2 days EXAM: CT HEAD WITHOUT CONTRAST TECHNIQUE: Contiguous axial images were obtained from the base of the skull through the vertex without intravenous contrast. COMPARISON:  None. FINDINGS: The bony calvarium is intact. Some scattered areas of sclerosis are noted which may be related to the patient's known history of prior metastatic prostate carcinoma. Diffuse atrophic changes are seen. Chronic white matter ischemic changes are noted. No acute hemorrhage, acute infarction or space-occupying mass lesion is identified. IMPRESSION: Chronic atrophic and ischemic changes. No acute intracranial abnormality is noted. Sclerotic foci within the skullbase which may be related to the previous history of metastatic prostate carcinoma. Electronically Signed   By: Elta Guadeloupe  Lukens M.D.   On: 05/02/2015 10:11   Mr Brain Wo Contrast  05/04/2015  CLINICAL DATA:  Altered mental status.  Increasing weakness. EXAM: MRI HEAD  WITHOUT CONTRAST TECHNIQUE: Multiplanar, multiecho pulse sequences of the brain and surrounding structures were obtained without intravenous contrast. COMPARISON:  CT head 05/02/2015. FINDINGS: The patient was unable to remain motionless for the exam. Small or subtle lesions could be overlooked. Furthermore, due to lack of patient cooperation, the exam was truncated prematurely. No restricted diffusion is definitely identified. There is advanced atrophy with hydrocephalus ex vacuo. Extensive white matter disease is suspected. There is no definite midline shift, mass lesion, or visible hemorrhage. When correlated with recent CT, a similar appearance is noted. IMPRESSION: Marginally diagnostic exam due to patient motion and premature truncation due to lack of cooperation. No visible acute infarction, intracranial mass lesion, or midline shift. Electronically Signed   By: Staci Righter M.D.   On: 05/04/2015 15:30    Micro Results     Recent Results (from the past 240 hour(s))  Culture, blood (routine x 2) Call MD if unable to obtain prior to antibiotics being given     Status: None (Preliminary result)   Collection Time: 05/02/15 11:35 AM  Result Value Ref Range Status   Specimen Description BLOOD RIGHT ARM  Final   Special Requests BOTTLES DRAWN AEROBIC AND ANAEROBIC 10CC  Final   Culture NO GROWTH 3 DAYS  Final   Report Status PENDING  Incomplete  Culture, blood (routine x 2) Call MD if unable to obtain prior to antibiotics being given     Status: None (Preliminary result)   Collection Time: 05/02/15 11:55 AM  Result Value Ref Range Status   Specimen Description BLOOD RIGHT ARM  Final   Special Requests BOTTLES DRAWN AEROBIC AND ANAEROBIC 10CC  Final   Culture NO GROWTH 3 DAYS  Final   Report Status PENDING  Incomplete       Today   Subjective:   Stephen English today is minimally communicative.  Objective:   Blood pressure 114/58, pulse 55, temperature 97.8 F (36.6 C), temperature  source Axillary, resp. rate 17, height 5\' 9"  (1.753 m), weight 99.6 kg (219 lb 9.3 oz), SpO2 97 %.   Intake/Output Summary (Last 24 hours) at 05/06/15 0947 Last data filed at 05/06/15 0027  Gross per 24 hour  Intake    120 ml  Output      0 ml  Net    120 ml    Exam Frail, elderly , confused . Supple Neck,No JVD,  Symmetrical Chest wall movement, Good air movement bilaterally, no wheezing ,No Gallops,Rubs or new Murmurs, No Parasternal Heave +ve B.Sounds, Abd Soft, No tenderness, No organomegaly appriciated, No rebound - guarding or rigidity. No Cyanosis, Clubbing or edema, no focal deficits could be identified on physical exam, has generalized weakness.  Data Review   CBC w Diff: Lab Results  Component Value Date   WBC 6.8 05/03/2015   WBC 7.6 11/06/2014   HGB 11.0* 05/03/2015   HGB 13.3 11/06/2014   HCT 33.7* 05/03/2015   HCT 40.6 11/06/2014   PLT 269 05/03/2015   PLT 256 11/06/2014   LYMPHOPCT 20 05/02/2015   LYMPHOPCT 21.8 11/06/2014   MONOPCT 8 05/02/2015   MONOPCT 6.9 11/06/2014   EOSPCT 5 05/02/2015   EOSPCT 5.0 11/06/2014   BASOPCT 1 05/02/2015   BASOPCT 1.3 11/06/2014    CMP: Lab Results  Component Value Date   NA 140 05/03/2015  NA 142 11/06/2014   K 3.8 05/03/2015   K 4.3 11/06/2014   CL 104 05/03/2015   CL 107 11/18/2012   CO2 29 05/03/2015   CO2 29 11/06/2014   BUN 11 05/03/2015   BUN 11.9 11/06/2014   CREATININE 0.80 05/03/2015   CREATININE 0.8 11/06/2014   PROT 6.4* 05/02/2015   PROT 6.9 11/06/2014   ALBUMIN 3.0* 05/02/2015   ALBUMIN 3.1* 11/06/2014   BILITOT 0.6 05/02/2015   BILITOT 0.33 11/06/2014   ALKPHOS 50 05/02/2015   ALKPHOS 57 11/06/2014   AST 19 05/02/2015   AST 12 11/06/2014   ALT 9* 05/02/2015   ALT 6 11/06/2014  .   Total Time in preparing paper work, data evaluation and todays exam - 35 minutes  ELGERGAWY, DAWOOD M.D on 05/06/2015 at 9:47 AM  [facility]   Office  [phone]

## 2015-05-06 NOTE — Care Management Important Message (Signed)
Important Message  Patient Details  Name: ZAKARI SPRAU MRN: [id number] Date of Birth: 11-01-1924   Medicare Important Message Given:  Yes    Jarl Sellitto P Feleica Fulmore 05/06/2015, 2:30 PM

## 2015-05-06 NOTE — Discharge Instructions (Signed)
Follow with Primary MD FRIED, [name], MD after dischrge from SNF  Get CBC, CMP, 2 view Chest X ray checked  by Primary MD next visit.    Activity: As tolerated with Full fall precautions use walker/cane & assistance as needed   Disposition SNF   Diet: Heart Healthy  , with feeding assistance and aspiration precautions.  For Heart failure patients - Check your Weight same time everyday, if you gain over 2 pounds, or you develop in leg swelling, experience more shortness of breath or chest pain, call your Primary MD immediately. Follow Cardiac Low Salt Diet and 1.5 lit/day fluid restriction.   On your next visit with your primary care physician please Get Medicines reviewed and adjusted.   Please request your Prim.MD to go over all Hospital Tests and Procedure/Radiological results at the follow up, please get all Hospital records sent to your Prim MD by signing hospital release before you go home.   If you experience worsening of your admission symptoms, develop shortness of breath, life threatening emergency, suicidal or homicidal thoughts you must seek medical attention immediately by calling 911 or calling your MD immediately  if symptoms less severe.  You Must read complete instructions/literature along with all the possible adverse reactions/side effects for all the Medicines you take and that have been prescribed to you. Take any new Medicines after you have completely understood and accpet all the possible adverse reactions/side effects.   Do not drive, operating heavy machinery, perform activities at heights, swimming or participation in water activities or provide baby sitting services if your were admitted for syncope or siezures until you have seen by Primary MD or a Neurologist and advised to do so again.  Do not drive when taking Pain medications.    Do not take more than prescribed Pain, Sleep and Anxiety Medications  Special Instructions: If you have smoked or chewed  Tobacco  in the last 2 yrs please stop smoking, stop any regular Alcohol  and or any Recreational drug use.  Wear Seat belts while driving.   Please note  You were cared for by a hospitalist during your hospital stay. If you have any questions about your discharge medications or the care you received while you were in the hospital after you are discharged, you can call the unit and asked to speak with the hospitalist on call if the hospitalist that took care of you is not available. Once you are discharged, your primary care physician will handle any further medical issues. Please note that NO REFILLS for any discharge medications will be authorized once you are discharged, as it is imperative that you return to your primary care physician (or establish a relationship with a primary care physician if you do not have one) for your aftercare needs so that they can reassess your need for medications and monitor your lab values.

## 2015-05-06 NOTE — Progress Notes (Signed)
Patient adminstered his 7:30 dose of synthroid.  Unknown if patient swallowed pill completley as he refused water. [facility] MD at bedside made aware of difficulties in giving medication.     Larrie Kass

## 2015-05-07 LAB — CULTURE, BLOOD (ROUTINE X 2)
CULTURE: NO GROWTH
Culture: NO GROWTH

## 2015-05-07 LAB — LEGIONELLA PNEUMOPHILA SEROGP 1 UR AG: L. pneumophila Serogp 1 Ur Ag: NEGATIVE

## 2015-05-09 ENCOUNTER — Ambulatory Visit: Payer: Medicare Other | Admitting: Oncology

## 2015-05-09 ENCOUNTER — Other Ambulatory Visit: Payer: Medicare Other

## 2015-05-14 ENCOUNTER — Telehealth: Payer: Self-pay | Admitting: Oncology

## 2015-05-14 NOTE — Telephone Encounter (Signed)
Returned call to wife re cancelling 11/29 appointments due to patient d/c from hosp and now in rehab. Wife aware appointments cxd and will call back to r/s when needed.

## 2015-05-21 ENCOUNTER — Ambulatory Visit: Payer: Medicare Other | Admitting: Oncology

## 2015-05-21 ENCOUNTER — Other Ambulatory Visit: Payer: Self-pay | Admitting: *Deleted

## 2015-05-21 ENCOUNTER — Other Ambulatory Visit: Payer: Self-pay

## 2015-05-21 DIAGNOSIS — C61 Malignant neoplasm of prostate: Secondary | ICD-10-CM

## 2015-05-21 DIAGNOSIS — C7951 Secondary malignant neoplasm of bone: Secondary | ICD-10-CM

## 2015-05-21 MED ORDER — ENZALUTAMIDE 40 MG PO CAPS: 120.0000 mg | ORAL_CAPSULE | Freq: Every day | ORAL | Status: AC

## 2015-06-23 DEATH — deceased

## 2021-09-21 ENCOUNTER — Encounter: Payer: Self-pay | Admitting: Oncology

## 2021-10-11 ENCOUNTER — Other Ambulatory Visit: Payer: Self-pay | Admitting: Nurse Practitioner
